# Patient Record
Sex: Male | Born: 1945 | Race: Black or African American | Hispanic: No | Smoking: Former smoker
Health system: Southern US, Community
[De-identification: ages and names within clinical notes are randomized; demographics above are authoritative.]

## PROBLEM LIST (undated history)

## (undated) DIAGNOSIS — I10 Essential (primary) hypertension: Secondary | ICD-10-CM

## (undated) DIAGNOSIS — C801 Malignant (primary) neoplasm, unspecified: Secondary | ICD-10-CM

## (undated) DIAGNOSIS — E119 Type 2 diabetes mellitus without complications: Secondary | ICD-10-CM

---

## 2014-03-31 ENCOUNTER — Emergency Department: Payer: Self-pay | Admitting: Emergency Medicine

## 2016-11-21 ENCOUNTER — Inpatient Hospital Stay
Admission: EM | Admit: 2016-11-21 | Discharge: 2016-11-29 | DRG: 004 | Disposition: A | Discharge status: Home / Self Care | Payer: Medicare Other | Attending: Internal Medicine | Admitting: Internal Medicine

## 2016-11-21 ENCOUNTER — Encounter: Payer: Self-pay | Admitting: Emergency Medicine

## 2016-11-21 DIAGNOSIS — Z888 Allergy status to other drugs, medicaments and biological substances status: Secondary | ICD-10-CM | POA: Diagnosis not present

## 2016-11-21 DIAGNOSIS — I10 Essential (primary) hypertension: Secondary | ICD-10-CM | POA: Diagnosis present

## 2016-11-21 DIAGNOSIS — J9690 Respiratory failure, unspecified, unspecified whether with hypoxia or hypercapnia: Secondary | ICD-10-CM

## 2016-11-21 DIAGNOSIS — Z79899 Other long term (current) drug therapy: Secondary | ICD-10-CM

## 2016-11-21 DIAGNOSIS — J9600 Acute respiratory failure, unspecified whether with hypoxia or hypercapnia: Secondary | ICD-10-CM | POA: Diagnosis present

## 2016-11-21 DIAGNOSIS — Z91013 Allergy to seafood: Secondary | ICD-10-CM | POA: Diagnosis not present

## 2016-11-21 DIAGNOSIS — E876 Hypokalemia: Secondary | ICD-10-CM | POA: Diagnosis not present

## 2016-11-21 DIAGNOSIS — T783XXA Angioneurotic edema, initial encounter: Secondary | ICD-10-CM | POA: Diagnosis present

## 2016-11-21 DIAGNOSIS — Z930 Tracheostomy status: Secondary | ICD-10-CM

## 2016-11-21 DIAGNOSIS — M6281 Muscle weakness (generalized): Secondary | ICD-10-CM

## 2016-11-21 DIAGNOSIS — E1165 Type 2 diabetes mellitus with hyperglycemia: Secondary | ICD-10-CM | POA: Diagnosis present

## 2016-11-21 HISTORY — DX: Type 2 diabetes mellitus without complications: E11.9

## 2016-11-21 HISTORY — DX: Malignant (primary) neoplasm, unspecified: C80.1

## 2016-11-21 HISTORY — DX: Essential (primary) hypertension: I10

## 2016-11-21 LAB — COMPREHENSIVE METABOLIC PANEL
ALBUMIN: 3.5 g/dL (ref 3.5–5.0)
ALT: 20 U/L (ref 17–63)
AST: 27 U/L (ref 15–41)
Alkaline Phosphatase: 61 U/L (ref 38–126)
Anion gap: 9 (ref 5–15)
BUN: 13 mg/dL (ref 6–20)
CHLORIDE: 108 mmol/L (ref 101–111)
CO2: 20 mmol/L — AB (ref 22–32)
CREATININE: 0.71 mg/dL (ref 0.61–1.24)
Calcium: 8.9 mg/dL (ref 8.9–10.3)
GFR calc Af Amer: >60 mL/min (ref >60)
GFR calc non Af Amer: >60 mL/min (ref >60)
GLUCOSE: 259 mg/dL — AB (ref 65–99)
Potassium: 3.4 mmol/L — ABNORMAL LOW (ref 3.5–5.1)
Sodium: 137 mmol/L (ref 135–145)
Total Bilirubin: 0.4 mg/dL (ref 0.3–1.2)
Total Protein: 6.6 g/dL (ref 6.5–8.1)

## 2016-11-21 LAB — CBC WITH DIFFERENTIAL/PLATELET
Basophils Absolute: 0 10*3/uL (ref 0–0.1)
Basophils Relative: 1 %
Eosinophils Absolute: 0.1 10*3/uL (ref 0–0.7)
Eosinophils Relative: 1 %
HEMATOCRIT: 36.4 % — AB (ref 40.0–52.0)
HEMOGLOBIN: 11.6 g/dL — AB (ref 13.0–18.0)
Lymphocytes Relative: 45 %
Lymphs Abs: 4 10*3/uL — ABNORMAL HIGH (ref 1.0–3.6)
MCH: 27.8 pg (ref 26.0–34.0)
MCHC: 31.8 g/dL — AB (ref 32.0–36.0)
MCV: 87.5 fL (ref 80.0–100.0)
MONOS PCT: 7 %
Monocytes Absolute: 0.7 10*3/uL (ref 0.2–1.0)
NEUTROS ABS: 4.1 10*3/uL (ref 1.4–6.5)
NEUTROS PCT: 46 %
Platelets: 257 10*3/uL (ref 150–440)
RBC: 4.16 MIL/uL — ABNORMAL LOW (ref 4.40–5.90)
RDW: 14.7 % — ABNORMAL HIGH (ref 11.5–14.5)
WBC: 8.9 10*3/uL (ref 3.8–10.6)

## 2016-11-21 LAB — PROTIME-INR
INR: 1.03
Prothrombin Time: 13.5 seconds (ref 11.4–15.2)

## 2016-11-21 LAB — GLUCOSE, CAPILLARY: GLUCOSE-CAPILLARY: 154 mg/dL — AB (ref 65–99)

## 2016-11-21 LAB — ETHANOL: Alcohol, Ethyl (B): <5 mg/dL (ref <5)

## 2016-11-21 LAB — LIPASE, BLOOD: Lipase: 21 U/L (ref 11–51)

## 2016-11-21 LAB — TROPONIN I: Troponin I: <0.03 ng/mL (ref <0.03)

## 2016-11-21 LAB — MRSA PCR SCREENING: MRSA BY PCR: NEGATIVE

## 2016-11-21 MED ORDER — PROPOFOL 1000 MG/100ML IV EMUL
INTRAVENOUS | Status: AC
  Filled 2016-11-21: qty 100

## 2016-11-21 MED ORDER — PROPOFOL 1000 MG/100ML IV EMUL
5.0000 ug/kg/min | INTRAVENOUS | Status: DC
  Administered 2016-11-22: 40 ug/kg/min via INTRAVENOUS
  Administered 2016-11-22: 25 ug/kg/min via INTRAVENOUS
  Filled 2016-11-21 (×3): qty 100

## 2016-11-21 MED ORDER — FENTANYL 2500MCG IN NS 250ML (10MCG/ML) PREMIX INFUSION
100.0000 ug/h | INTRAVENOUS | Status: DC
  Administered 2016-11-21: 100 ug/h via INTRAVENOUS

## 2016-11-21 MED ORDER — METHYLPREDNISOLONE SODIUM SUCC 125 MG IJ SOLR
60.0000 mg | Freq: Four times a day (QID) | INTRAMUSCULAR | Status: DC
  Administered 2016-11-22 (×2): 60 mg via INTRAVENOUS
  Filled 2016-11-21 (×2): qty 2

## 2016-11-21 MED ORDER — FAMOTIDINE IN NACL 20-0.9 MG/50ML-% IV SOLN
20.0000 mg | Freq: Two times a day (BID) | INTRAVENOUS | Status: DC
  Administered 2016-11-22 (×2): 20 mg via INTRAVENOUS
  Filled 2016-11-21 (×2): qty 50

## 2016-11-21 MED ORDER — DIPHENHYDRAMINE HCL 50 MG/ML IJ SOLN
50.0000 mg | Freq: Once | INTRAMUSCULAR | Status: AC
  Administered 2016-11-21: 50 mg via INTRAVENOUS

## 2016-11-21 MED ORDER — SODIUM CHLORIDE 0.9 % IV SOLN
INTRAVENOUS | Status: AC | PRN
  Administered 2016-11-21: 1000 mL via INTRAVENOUS

## 2016-11-21 MED ORDER — FENTANYL 2500MCG IN NS 250ML (10MCG/ML) PREMIX INFUSION
INTRAVENOUS | Status: AC
  Filled 2016-11-21: qty 250

## 2016-11-21 MED ORDER — FAMOTIDINE IN NACL 20-0.9 MG/50ML-% IV SOLN
20.0000 mg | Freq: Once | INTRAVENOUS | Status: AC
  Administered 2016-11-21: 20 mg via INTRAVENOUS

## 2016-11-21 MED ORDER — SODIUM CHLORIDE 0.9 % IV SOLN
3.0000 g | INTRAVENOUS | Status: AC
  Administered 2016-11-22: 3 g via INTRAVENOUS
  Filled 2016-11-21: qty 3

## 2016-11-21 MED ORDER — PROPOFOL 1000 MG/100ML IV EMUL
INTRAVENOUS | Status: AC | PRN
  Administered 2016-11-21: 20 ug/kg/min via INTRAVENOUS

## 2016-11-21 MED ORDER — SUCCINYLCHOLINE CHLORIDE 20 MG/ML IJ SOLN
INTRAMUSCULAR | Status: AC | PRN
  Administered 2016-11-21: 100 mg via INTRAVENOUS

## 2016-11-21 MED ORDER — SODIUM CHLORIDE 0.9 % IV SOLN
INTRAVENOUS | Status: DC
  Administered 2016-11-21: 23:00:00 via INTRAVENOUS

## 2016-11-21 MED ORDER — FENTANYL 2500MCG IN NS 250ML (10MCG/ML) PREMIX INFUSION: 25.0000 ug/h | INTRAVENOUS | Status: DC

## 2016-11-21 MED ORDER — METHYLPREDNISOLONE SODIUM SUCC 125 MG IJ SOLR
125.0000 mg | Freq: Once | INTRAMUSCULAR | Status: AC
  Administered 2016-11-21: 125 mg via INTRAVENOUS

## 2016-11-21 MED ORDER — DIPHENHYDRAMINE HCL 50 MG/ML IJ SOLN
12.5000 mg | Freq: Four times a day (QID) | INTRAMUSCULAR | Status: DC
  Administered 2016-11-22 – 2016-11-23 (×6): 12.5 mg via INTRAVENOUS
  Filled 2016-11-21 (×6): qty 1

## 2016-11-21 MED ORDER — FENTANYL CITRATE (PF) 100 MCG/2ML IJ SOLN
INTRAMUSCULAR | Status: AC | PRN
  Administered 2016-11-21: 100 ug via INTRAVENOUS

## 2016-11-21 MED ORDER — KETAMINE HCL 10 MG/ML IJ SOLN
INTRAMUSCULAR | Status: AC | PRN
  Administered 2016-11-21: 50 mg via INTRAVENOUS
  Administered 2016-11-21: 150 mg via INTRAVENOUS
  Administered 2016-11-21: 100 mg via INTRAVENOUS

## 2016-11-21 MED ORDER — SODIUM CHLORIDE 0.9 % IV SOLN: 250.0000 mL | INTRAVENOUS | Status: DC | PRN

## 2016-11-21 MED ORDER — VECURONIUM BROMIDE 10 MG IV SOLR
10.0000 mg | Freq: Once | INTRAVENOUS | Status: AC
  Administered 2016-11-21: 10 mg via INTRAVENOUS

## 2016-11-21 MED ORDER — EPINEPHRINE 0.3 MG/0.3ML IJ SOAJ
0.3000 mg | Freq: Once | INTRAMUSCULAR | Status: AC
  Administered 2016-11-21: 0.3 mg via INTRAMUSCULAR

## 2016-11-21 MED ORDER — HEPARIN SODIUM (PORCINE) 5000 UNIT/ML IJ SOLN
5000.0000 [IU] | Freq: Three times a day (TID) | INTRAMUSCULAR | Status: DC
  Administered 2016-11-22 – 2016-11-23 (×4): 5000 [IU] via SUBCUTANEOUS
  Filled 2016-11-21 (×4): qty 1

## 2016-11-21 MED ORDER — VECURONIUM BROMIDE 10 MG IV SOLR
INTRAVENOUS | Status: AC | PRN
  Administered 2016-11-21: 10 mg via INTRAVENOUS

## 2016-11-21 NOTE — ED Triage Notes (Signed)
Pt comes into the ED via POV with his wife c/o an allergic reaction.  Family unsure if it is from shellfish (which is what they were eating) or his lisinopril.  Patient presents with angioedema and difficulty breathing.  MD notified of difficult airway, swelling to the tongue and neck.

## 2016-11-21 NOTE — H&P (Signed)
PULMONARY / CRITICAL CARE MEDICINE   Name: Ethan Mills MRN: [id number] DOB: 12-May-1946    ADMISSION DATE:  11/21/2016 CONSULTATION DATE:  11/21/16  REFERRING MD:  Dr. Jeannie Fend  CHIEF COMPLAINT:  Acute respiratory Failure due to Angioedema  HISTORY OF PRESENT ILLNESS:   Ethan Mills is 70 year old male with no medical history on file. History was obtained from his wife present at bedside.  Wife states that patient went out to eat some seafood and came home with swelling of the throat and unable to speak.  Wife also states that he  Takes lisinopril for his high blood pressure.  He had swelling of his throat before but wife states that "it was not this bad".  Patient presented to ED with acute respiratory distress. Due to impending Respiratory Failure, ED physician attempted to intubate with Glyde laryngoscope without success.  Therefore ENT was called and had emergently trached at the bedside.  PCCM team was called to admit the patient.  PAST MEDICAL HISTORY :  He  has no past medical history on file.  PAST SURGICAL HISTORY: He  has no past surgical history on file.  Allergies  Allergen Reactions  . Shellfish Allergy Anaphylaxis    No current facility-administered medications on file prior to encounter.    No current outpatient prescriptions on file prior to encounter.    FAMILY HISTORY:  His has no family status information on file.    SOCIAL HISTORY:  REVIEW OF SYSTEMS:   Unable to obtain as the patient is sedated, trached and mechanically ventilated  SUBJECTIVE:  Unable to obtain as the patient is sedated ,trached and mechanically ventilated.   VITAL SIGNS: BP 98/72   Pulse (!) 105   Resp (!) 21   Ht 5\' 8"  (1.727 m)   Wt 87.3 kg (192 lb 8 oz)   SpO2 100%   BMI 29.27 kg/m   HEMODYNAMICS:    VENTILATOR SETTINGS:    INTAKE / OUTPUT: No intake/output data recorded.  PHYSICAL EXAMINATION: General:  AA male , trached with ET tube  Neuro:  Sedated   HEENT:  Atraumatic, normocephalic, severe swelling of the tongue, jaw and neck Cardiovascular: Tachycardic.S1S2, no MRG noted  Lungs:  Clear bilaterally , no wheezes, crackles or rhonchi noted Abdomen:  Soft, non tender, active bowel sounds Musculoskeletal:  No inflammation/deformity noted Skin:  Grossly intact  LABS:  BMET No results for input(s): NA, K, CL, CO2, BUN, CREATININE, GLUCOSE in the last 168 hours.  Electrolytes No results for input(s): CALCIUM, MG, PHOS in the last 168 hours.  CBC  Recent Labs Lab 11/21/16 1927  WBC 8.9  HGB 11.6*  HCT 36.4*  PLT 257    Coag's  Recent Labs Lab 11/21/16 1927  INR 1.03    Sepsis Markers No results for input(s): LATICACIDVEN, PROCALCITON, O2SATVEN in the last 168 hours.  ABG No results for input(s): PHART, PCO2ART, PO2ART in the last 168 hours.  Liver Enzymes No results for input(s): AST, ALT, ALKPHOS, BILITOT, ALBUMIN in the last 168 hours.  Cardiac Enzymes No results for input(s): TROPONINI, PROBNP in the last 168 hours.  Glucose No results for input(s): GLUCAP in the last 168 hours.  Imaging No results found.   STUDIES:  none  CULTURES: None   ANTIBIOTICS: Ampicillin Salbactam X1 dose  SIGNIFICANT EVENTS: 11/21/16>>  Patient presented to the Ed with Acute respiratory distress related to angioedema requiring emergent trach 12/21 Emergent Trach  LINES/TUBES: none   ASSESSMENT / PLAN:  PULMONARY  A: Acute Respiratory failure related to Angioedema possibly due to lisinopril E P:   Full vent support Keep the patient heavily sedated , RASS -4 TO -5 Propofol/ fentanyl  For sedation Continue solumedrol Continue Benadryl Continue Famotidine  CARDIOVASCULAR A:  No active issues P:  Continuous telemetry Will start pressors to keep MAP goals>65 if needed  RENAL A:   No active issues P:   Follow chemistry Replace electrolytes per usual guidelines  GASTROINTESTINAL A:   No active  issues P:   Will keep NPO HEMATOLOGIC A:   No active issues P:  Heparin for DVT prophylaxis Transfuse  If Hgb<7  INFECTIOUS A:   No active issues P:   Received Ampicillin Sulbactam X 1 for emergent Trach Follow CBC intermittently  ENDOCRINE A:   No active issues P:   BG checks intermittently with BMP  NEUROLOGIC A:   No active issues P:    Maintain RASS goal: of -4 to -5 Will get Re-evaluated by ENT on 12/22    Bincy Varughese,AG-ACNP Pulmonary and [place]   11/21/2016, 7:56 PM   Merton Border, MD PCCM service Mobile [phone] Pager [phone] 11/22/2016

## 2016-11-21 NOTE — Sedation Documentation (Signed)
ENT specialist at bedside

## 2016-11-21 NOTE — Op Note (Signed)
..  11/21/2016 7:37 PM  Ethan Mills [id number]  Pre-Op Dx: acute respiratory failure  Post-Op Dx:  same  Proc:  Emergent BedsideTracheostomy  Surg:  Colvin Caroli:  GOT  EBL:  34ml  Comp:  none  Findings:  6.5 ETT placed within neck.  Difficult landmarks given marked submental and superior neck edema but felt to be either just beneath the cricoid cartilage.  Significant calcification of anterior cartilage.  Procedure:  The patient was emergently identified in the ED.  Ketamine sedation was already pushed by ED in attempt for oral intubation given impending decompensation.  A 15 blade scalpel was used to made a vertical incision in the patient's anterior neck and dissection was made vertically with a 15 blade scalpel until the anterior trachea was encountered.  Moderate amount of calcification was noted and a horizontal incision was made beneath this calcification and the airway was entered.  A boogie catheter was placed through the tracheostomy and a 6.5 ETT was placed overlying this catheter.  Upon first placement, the ETT went superiorly but upon replacement, it went inferiorly with good CO2 return and ventilation with saturations to 100%.    At this time the ETT was secured in place.  Surgicel was placed within the wound bed and a sterile 4X4 was packing adjacent to the ETT.   Comment: Patient will need sedation and needs to be treated as difficult airway.  ICU admission.  Medicine admission at attempt to figure out source of angioedema.  Abx are needed due to dirty case.  Will plan on evaluation in OR possibly tomorrow around noon if revision is needed.  Ethan Mills  7:37 PM 11/21/2016

## 2016-11-21 NOTE — Progress Notes (Signed)
Transported pt to ICU 3 on the vent without incident. Pt tol well report handed off. Decreased pt FIO2 40%.

## 2016-11-21 NOTE — ED Provider Notes (Signed)
[facility] Emergency Department Provider Note  ____________________________________________   First MD Initiated Contact with Patient 11/21/16 1849     (approximate)  I have reviewed the triage vital signs and the nursing notes.   HISTORY  Chief Complaint No chief complaint on file.  History limited by acute angioedema  HPI Ethan Mills is a 70 y.o. male had any reported medical history due to the urgency of situation who presents with acute onset swelling of his tongue and throat.  This reportedly started just minutes ago.  His wife was not sure what happened, but apparently the patient ate some seafood and then developed some swelling.  However he has eaten seafood before and only had some mild swelling around his eyes.  Within the unknown number of minutes since the onset of symptoms, his is swollen to the point that he cannot close his mouth and he has started to drool.  He denies any difficulty breathing but is only able to speak in a couple of words and is having trouble swallowing.  No other history is obtainable at this time.  I asked the patient and his wife if they know if he is on a medication called lisinopril or something that sounds like that, and they do not know.  I mentioned that he goes to the [place] but upon subsequent review of care everywhere I cannot find records with the [facility].Of note the patient has had no recent infectious symptoms and has not had a sore throat or dental problems of which anyone is aware.   No past medical history on file.  There are no active problems to display for this patient.   No past surgical history on file.  Prior to Admission medications   Not on File    Allergies Shellfish allergy  No family history on file.  Social History Level 5 caveat: Unable to obtain due to critical illness   Review of Systems L5 caveat: Unable to obtain due to critical  illness  ____________________________________________   PHYSICAL EXAM:  VITAL SIGNS: ED Triage Vitals  Enc Vitals Group     BP 11/21/16 1923 (!) 201/90     Pulse Rate 11/21/16 1923 (!) 127     Resp 11/21/16 1923 (!) 21     Temp --      Temp src --      SpO2 11/21/16 1923 100 %     Weight 11/21/16 1853 192 lb 8 oz (87.3 kg)     Height 11/21/16 1853 5\' 8"  (1.727 m)     Head Circumference --      Peak Flow --      Pain Score --      Pain Loc --      Pain Edu? --      Excl. in [place]? --     Constitutional: Alert and oriented.  Angioedema is obvious at a distance Eyes: Conjunctivae are normal. PERRL. EOMI. Head: Atraumatic. Nose: No congestion/rhinnorhea. Mouth/Throat/Neck: Mucous membranes are moist.  Severe swelling of the tongue and additional swelling that is firm is palpable below the jaw (submandibular induration) insistent with severe angioedema that appears to be tracking posteriorly.  The patient is gurgling and drooling and maintaining his airway currently not able to swallow well Cardiovascular: Tachycardia, regular rhythm. Good peripheral circulation. Grossly normal heart sounds. Respiratory: Increased respiratory effort.  No retractions. Lungs CTAB. Gastrointestinal: Soft and nontender. No distention.  Musculoskeletal: No lower extremity tenderness nor edema. No gross deformities of  extremities. Neurologic:  Normal speech and language. No gross focal neurologic deficits are appreciated.  Skin:  Skin is warm, dry and intact. No rash noted.  ____________________________________________   LABS (all labs ordered are listed, but only abnormal results are displayed)  Labs Reviewed  CBC WITH DIFFERENTIAL/PLATELET - Abnormal; Notable for the following:       Result Value   RBC 4.16 (*)    Hemoglobin 11.6 (*)    HCT 36.4 (*)    MCHC 31.8 (*)    RDW 14.7 (*)    Lymphs Abs 4.0 (*)    All other components within normal limits  PROTIME-INR  COMPREHENSIVE METABOLIC PANEL   ETHANOL  LIPASE, BLOOD  TROPONIN I  CBC  BASIC METABOLIC PANEL  MAGNESIUM  PHOSPHORUS  TYPE AND SCREEN   ____________________________________________  EKG  None - EKG not ordered by ED physician ____________________________________________  RADIOLOGY   No results found.  ____________________________________________   PROCEDURES  Procedure(s) performed:   .Critical Care Performed by: Hinda Kehr Authorized by: Hinda Kehr   Critical care provider statement:    Critical care time (minutes):  60   Critical care time was exclusive of:  Separately billable procedures and treating other patients   Critical care was necessary to treat or prevent imminent or life-threatening deterioration of the following conditions:  Respiratory failure   Critical care was time spent personally by me on the following activities:  Development of treatment plan with patient or surrogate, discussions with consultants, evaluation of patient's response to treatment, examination of patient, obtaining history from patient or surrogate, ordering and performing treatments and interventions, ordering and review of laboratory studies, ordering and review of radiographic studies, pulse oximetry, re-evaluation of patient's condition and review of old charts Procedure Name: Awake intubation Date/Time: 11/21/2016 7:45 PM Performed by: Hinda Kehr Pre-anesthesia Checklist: Patient identified, Emergency Drugs available and Patient being monitored Oxygen Delivery Method: Nasal cannula Intubation Type: IV induction and Rapid sequence Laryngoscope Size: Glidescope and 3 Tube size: 6.5 mm Number of attempts: 2 Airway Equipment and Method: Oral airway Difficulty Due To: Difficulty was anticipated, Difficult Airway- due to large tongue, Difficult Airway-  due to edematous airway and Difficult Airway-due to vocal cord/laryngeal edema Comments: Failure of intubation due to severe angioedema - emergent  tracheotomy was required and performed by Dr. Pryor Ochoa (ENT)        Critical Care performed: Yes, see critical care procedure note(s) ____________________________________________   INITIAL IMPRESSION / ASSESSMENT AND PLAN / ED COURSE  Pertinent labs & imaging results that were available during my care of the patient were reviewed by me and considered in my medical decision making (see chart for details).  History is assessed the patient I realized that he would need an emergent airway and possibly cricothyrotomy or tracheotomy.  We moved him to room 3 and obtained all of the difficult airway equipment and intubation equipment.  I also called and spoke by phone with Dr. Pryor Ochoa ENT specialist and told them he was needed emergently.  Dr. Pryor Ochoa was already in the car and driving and at the time that we hung up with an estimated time of arrival about 13 minutes.  In the meantime we got the patient presents to the monitor and he was acutely decompensating even over the few minutes that were passing with developing stridor and increasing difficulty breathing.  He remained alert and oriented throughout with oxygen saturation of 100% with a nasal cannula on 10 L in his nose.  Once  IV access was established and he was on the monitor and administered ketamine in order to attempt intubation by glidescope.  The amount of angioedema was severe and I could not visualize anything of the patient's airway.  I attempted to use a bougie but that was also unsuccessful I tried one more attempt with a Glidescope and at that point Dr. Pryor Ochoa arrived.  He visualized airway once with the glide scope and then moved to surgical airway.  I assisted him while he performed a bedside tracheotomy which was successful with passage of a 5-0 ETT with good color change.  We started him on a propofol and fentanyl drips and gave vecuronium 10 mg IV for paralytic.  Will contact ICU for admission.  Patient hemodynamically stable at this  time.    Clinical Course as of Nov 21 2012  Thu Nov 21, 2016  1940 Spoke by phone with [facility] physician.  The mid-level provider is also present in the ED and evaluating the patient in person in room 3.  [CF]  1945 Ordering Unasyn 3 g IV given the emergent nature of the procedure and the fact that while we did our best to maintain sterile precautions it was not a completely sterile airway as it would have been in the operating room.  [CF]    Clinical Course User Index [CF] Hinda Kehr, MD    ____________________________________________  FINAL CLINICAL IMPRESSION(S) / ED DIAGNOSES  Final diagnoses:  Angioedema, initial encounter  Acute respiratory failure, unspecified whether with hypoxia or hypercapnia (Home)     MEDICATIONS GIVEN DURING THIS VISIT:  Medications  Ampicillin-Sulbactam (UNASYN) 3 g in sodium chloride 0.9 % 100 mL IVPB (not administered)  0.9 %  sodium chloride infusion (not administered)  heparin injection 5,000 Units (not administered)  famotidine (PEPCID) IVPB 20 mg premix (not administered)  0.9 %  sodium chloride infusion (not administered)  propofol (DIPRIVAN) 1000 MG/100ML infusion (not administered)  fentaNYL 2521mcg in NS 265mL (45mcg/ml) infusion-PREMIX (not administered)  diphenhydrAMINE (BENADRYL) injection 12.5 mg (not administered)  methylPREDNISolone sodium succinate (SOLU-MEDROL) 125 mg/2 mL injection 60 mg (not administered)  EPINEPHrine (EPI-PEN) injection 0.3 mg (0.3 mg Intramuscular Given 11/21/16 1858)  methylPREDNISolone sodium succinate (SOLU-MEDROL) 125 mg/2 mL injection 125 mg (125 mg Intravenous Given 11/21/16 1859)  diphenhydrAMINE (BENADRYL) injection 50 mg (50 mg Intravenous Given 11/21/16 1859)  famotidine (PEPCID) IVPB 20 mg premix (20 mg Intravenous New Bag/Given 11/21/16 1939)  ketamine (KETALAR) injection (150 mg Intravenous Given 11/21/16 1908)  succinylcholine (ANECTINE) injection (100 mg Intravenous Given 11/21/16 1911)   vecuronium (NORCURON) injection (10 mg Intravenous Given 11/21/16 1920)  propofol (DIPRIVAN) 1000 MG/100ML infusion ( Intravenous Canceled Entry 11/21/16 1930)  0.9 %  sodium chloride infusion (1,000 mLs Intravenous New Bag/Given 11/21/16 1900)  fentaNYL (SUBLIMAZE) injection (100 mcg Intravenous Given 11/21/16 1913)     NEW OUTPATIENT MEDICATIONS STARTED DURING THIS VISIT:  New Prescriptions   No medications on file    Modified Medications   No medications on file    Discontinued Medications   No medications on file     Note:  This document was prepared using Dragon voice recognition software and may include unintentional dictation errors.    Hinda Kehr, MD 11/21/16 2013

## 2016-11-21 NOTE — ED Notes (Signed)
Attempted to call report to RN on the floor, RN unavailable at this time.  Will return my call.

## 2016-11-21 NOTE — Consult Note (Signed)
..   Ethan Mills, Ethan Mills [id number] 06-30-1946 Hinda Kehr, MD  Reason for Consult: respiratory distress  HPI: Called emergently to ED for airway compromise by Dr. Karma Greaser.  Patient showed up to ED in acute respiratory distress.  Due to impending airway failure, ED physicians attempted to intubate with Glyde laryngoscope without success.    Allergies:  Allergies  Allergen Reactions  . Shellfish Allergy Anaphylaxis    ROS: Review of systems normal other than 12 systems except per HPI.  PMH: No past medical history on file.  FH: No family history on file.  SH:  Social History   Social History  . Marital status: Widowed    Spouse name: N/A  . Number of children: N/A  . Years of education: N/A   Occupational History  . Not on file.   Social History Main Topics  . Smoking status: Not on file  . Smokeless tobacco: Not on file  . Alcohol use Not on file  . Drug use: Unknown  . Sexual activity: Not on file   Other Topics Concern  . Not on file   Social History Narrative  . No narrative on file    PSH: No past surgical history on file.  Physical  Exam: GEN-  Acute distress supine in bed   A/P: Emergent bedside tracheostomy tube placed with 6.5 ETT in neck.  Will plan on revision tomorrow around noon.  Steroids and abx given dirty case.   Ethan Mills 11/21/2016 7:33 PM

## 2016-11-21 NOTE — Progress Notes (Signed)
CH met with wife of Pt in hallway while medical attention given to Pt. Wife has survived breast cancer twice. Married to Pt for 16 years. Sister and brother in law arrived. CH is available.   11/21/16 1900  Clinical Encounter Type  Visited With Patient and family together;Health care provider  Visit Type ED  Referral From Nurse  Spiritual Encounters  Spiritual Needs Emotional  Stress Factors  Patient Stress Factors None identified  Family Stress Factors None identified

## 2016-11-22 ENCOUNTER — Inpatient Hospital Stay: Payer: Medicare Other | Admitting: Anesthesiology

## 2016-11-22 ENCOUNTER — Encounter
Admission: EM | Disposition: A | Discharge status: Home / Self Care | Payer: Self-pay | Source: Home / Self Care | Attending: Internal Medicine

## 2016-11-22 ENCOUNTER — Encounter: Payer: Self-pay | Admitting: Anesthesiology

## 2016-11-22 DIAGNOSIS — J9600 Acute respiratory failure, unspecified whether with hypoxia or hypercapnia: Secondary | ICD-10-CM

## 2016-11-22 HISTORY — PX: TRACHEOSTOMY TUBE PLACEMENT: SHX814

## 2016-11-22 LAB — BASIC METABOLIC PANEL
ANION GAP: 8 (ref 5–15)
BUN: 13 mg/dL (ref 6–20)
CO2: 20 mmol/L — ABNORMAL LOW (ref 22–32)
Calcium: 8.3 mg/dL — ABNORMAL LOW (ref 8.9–10.3)
Chloride: 109 mmol/L (ref 101–111)
Creatinine, Ser: 0.78 mg/dL (ref 0.61–1.24)
GFR calc Af Amer: >60 mL/min (ref >60)
GLUCOSE: 180 mg/dL — AB (ref 65–99)
POTASSIUM: 4.2 mmol/L (ref 3.5–5.1)
Sodium: 137 mmol/L (ref 135–145)

## 2016-11-22 LAB — CBC
HEMATOCRIT: 31.6 % — AB (ref 40.0–52.0)
Hemoglobin: 10.1 g/dL — ABNORMAL LOW (ref 13.0–18.0)
MCH: 27.7 pg (ref 26.0–34.0)
MCHC: 31.9 g/dL — ABNORMAL LOW (ref 32.0–36.0)
MCV: 86.8 fL (ref 80.0–100.0)
PLATELETS: 219 10*3/uL (ref 150–440)
RBC: 3.63 MIL/uL — AB (ref 4.40–5.90)
RDW: 14.2 % (ref 11.5–14.5)
WBC: 11.4 10*3/uL — AB (ref 3.8–10.6)

## 2016-11-22 LAB — GLUCOSE, CAPILLARY
GLUCOSE-CAPILLARY: 141 mg/dL — AB (ref 65–99)
Glucose-Capillary: 165 mg/dL — ABNORMAL HIGH (ref 65–99)

## 2016-11-22 LAB — MAGNESIUM: Magnesium: 1.5 mg/dL — ABNORMAL LOW (ref 1.7–2.4)

## 2016-11-22 LAB — PHOSPHORUS: Phosphorus: 3.1 mg/dL (ref 2.5–4.6)

## 2016-11-22 SURGERY — CREATION, TRACHEOSTOMY
Anesthesia: General | Wound class: Clean Contaminated

## 2016-11-22 MED ORDER — HYDRALAZINE HCL 20 MG/ML IJ SOLN
10.0000 mg | Freq: Four times a day (QID) | INTRAMUSCULAR | Status: DC | PRN
  Administered 2016-11-22 – 2016-11-23 (×2): 10 mg via INTRAVENOUS
  Filled 2016-11-22 (×2): qty 1

## 2016-11-22 MED ORDER — FENTANYL CITRATE (PF) 100 MCG/2ML IJ SOLN
INTRAMUSCULAR | Status: AC
  Filled 2016-11-22: qty 2

## 2016-11-22 MED ORDER — ONDANSETRON HCL 4 MG/2ML IJ SOLN
INTRAMUSCULAR | Status: DC | PRN
  Administered 2016-11-22: 4 mg via INTRAVENOUS

## 2016-11-22 MED ORDER — PROPOFOL 500 MG/50ML IV EMUL
INTRAVENOUS | Status: AC
  Filled 2016-11-22: qty 50

## 2016-11-22 MED ORDER — FENTANYL CITRATE (PF) 100 MCG/2ML IJ SOLN
INTRAMUSCULAR | Status: DC | PRN
  Administered 2016-11-22: 50 ug via INTRAVENOUS

## 2016-11-22 MED ORDER — VECURONIUM BROMIDE 10 MG IV SOLR
10.0000 mg | INTRAVENOUS | Status: AC
  Administered 2016-11-22: 10 mg via INTRAVENOUS

## 2016-11-22 MED ORDER — ORAL CARE MOUTH RINSE
15.0000 mL | Freq: Two times a day (BID) | OROMUCOSAL | Status: DC
  Administered 2016-11-23 – 2016-11-28 (×10): 15 mL via OROMUCOSAL

## 2016-11-22 MED ORDER — MIDAZOLAM HCL 2 MG/2ML IJ SOLN
INTRAMUSCULAR | Status: AC
  Filled 2016-11-22: qty 2

## 2016-11-22 MED ORDER — INSULIN ASPART 100 UNIT/ML ~~LOC~~ SOLN
0.0000 [IU] | SUBCUTANEOUS | Status: DC
  Administered 2016-11-22 – 2016-11-23 (×2): 3 [IU] via SUBCUTANEOUS
  Administered 2016-11-23: 4 [IU] via SUBCUTANEOUS
  Administered 2016-11-23: 3 [IU] via SUBCUTANEOUS
  Filled 2016-11-22: qty 4
  Filled 2016-11-22 (×3): qty 3
  Filled 2016-11-22: qty 4

## 2016-11-22 MED ORDER — LIDOCAINE 2% (20 MG/ML) 5 ML SYRINGE
INTRAMUSCULAR | Status: AC
  Filled 2016-11-22: qty 5

## 2016-11-22 MED ORDER — PROPOFOL 500 MG/50ML IV EMUL
INTRAVENOUS | Status: DC | PRN
Start: 2016-11-22 — End: 2016-11-22
  Administered 2016-11-22: 40 ug/kg/min via INTRAVENOUS

## 2016-11-22 MED ORDER — ORAL CARE MOUTH RINSE
15.0000 mL | Freq: Four times a day (QID) | OROMUCOSAL | Status: DC
  Administered 2016-11-22: 15 mL via OROMUCOSAL

## 2016-11-22 MED ORDER — METHYLPREDNISOLONE SODIUM SUCC 125 MG IJ SOLR
40.0000 mg | Freq: Two times a day (BID) | INTRAMUSCULAR | Status: DC
  Administered 2016-11-22 – 2016-11-23 (×2): 40 mg via INTRAVENOUS
  Filled 2016-11-22 (×2): qty 2

## 2016-11-22 MED ORDER — ONDANSETRON HCL 4 MG/2ML IJ SOLN
INTRAMUSCULAR | Status: AC
  Filled 2016-11-22: qty 2

## 2016-11-22 MED ORDER — STERILE WATER FOR INJECTION IJ SOLN
INTRAMUSCULAR | Status: AC
  Administered 2016-11-22: 04:00:00
  Filled 2016-11-22: qty 10

## 2016-11-22 MED ORDER — SODIUM CHLORIDE 0.9 % IV SOLN
INTRAVENOUS | Status: DC | PRN
  Administered 2016-11-22: 150 ug/h via INTRAVENOUS

## 2016-11-22 MED ORDER — LACTATED RINGERS IV SOLN
INTRAVENOUS | Status: DC
  Administered 2016-11-22 – 2016-11-25 (×5): via INTRAVENOUS

## 2016-11-22 MED ORDER — CHLORHEXIDINE GLUCONATE 0.12% ORAL RINSE (MEDLINE KIT)
15.0000 mL | Freq: Two times a day (BID) | OROMUCOSAL | Status: DC
  Administered 2016-11-22 – 2016-11-29 (×13): 15 mL via OROMUCOSAL

## 2016-11-22 MED ORDER — VECURONIUM BROMIDE 10 MG IV SOLR
INTRAVENOUS | Status: AC
  Administered 2016-11-22: 04:00:00
  Filled 2016-11-22: qty 10

## 2016-11-22 MED ORDER — MIDAZOLAM HCL 2 MG/2ML IJ SOLN
INTRAMUSCULAR | Status: DC | PRN
  Administered 2016-11-22: 2 mg via INTRAVENOUS

## 2016-11-22 SURGICAL SUPPLY — 29 items
BLADE SURG 15 STRL LF DISP TIS (BLADE) ×1 IMPLANT
BLADE SURG 15 STRL SS (BLADE) ×2
BLADE SURG SZ11 CARB STEEL (BLADE) ×3 IMPLANT
CANISTER SUCT 1200ML W/VALVE (MISCELLANEOUS) ×3 IMPLANT
CORD BIP STRL DISP 12FT (MISCELLANEOUS) ×3 IMPLANT
ELECT REM PT RETURN 9FT ADLT (ELECTROSURGICAL) ×3
ELECTRODE REM PT RTRN 9FT ADLT (ELECTROSURGICAL) ×1 IMPLANT
FORCEPS JEWEL BIP 4-3/4 STR (INSTRUMENTS) ×3 IMPLANT
GAUZE IODOFORM PACK 1/2 7832 (GAUZE/BANDAGES/DRESSINGS) ×3 IMPLANT
GLOVE BIO SURGEON STRL SZ7.5 (GLOVE) ×3 IMPLANT
GOWN STRL REUS W/ TWL LRG LVL3 (GOWN DISPOSABLE) ×2 IMPLANT
GOWN STRL REUS W/TWL LRG LVL3 (GOWN DISPOSABLE) ×4
HARMONIC SCALPEL FOCUS (MISCELLANEOUS) ×3 IMPLANT
HEMOSTAT SURGICEL 2X3 (HEMOSTASIS) ×3 IMPLANT
HLDR TRACH TUBE NECKBAND 18 (MISCELLANEOUS) ×1 IMPLANT
HOLDER TRACH TUBE NECKBAND 18 (MISCELLANEOUS) ×2
KIT RM TURNOVER STRD PROC AR (KITS) ×3 IMPLANT
LABEL OR SOLS (LABEL) ×3 IMPLANT
NS IRRIG 500ML POUR BTL (IV SOLUTION) ×3 IMPLANT
PACK HEAD/NECK (MISCELLANEOUS) ×3 IMPLANT
SPONGE DRAIN TRACH 4X4 STRL 2S (GAUZE/BANDAGES/DRESSINGS) ×3 IMPLANT
SPONGE KITTNER 5P (MISCELLANEOUS) ×3 IMPLANT
SUCTION FRAZIER HANDLE 10FR (MISCELLANEOUS) ×2
SUCTION TUBE FRAZIER 10FR DISP (MISCELLANEOUS) ×1 IMPLANT
SUT SILK 2 0 SH (SUTURE) ×12 IMPLANT
SUT VIC AB 3-0 PS2 18 (SUTURE) ×3 IMPLANT
SYR 3ML LL SCALE MARK (SYRINGE) ×3 IMPLANT
TUBE TRACH SHILEY  6 DIST  CUF (TUBING) ×3 IMPLANT
TUBE TRACH SHILEY 8 DIST CUF (TUBING) ×3 IMPLANT

## 2016-11-22 NOTE — Progress Notes (Signed)
.. 11/22/2016 4:55 PM  Ethan Mills [id number]  Post-Op Day 1,0    Temp:  [97 F (36.1 C)-99 F (37.2 C)] 97.5 F (36.4 C) (12/22 1600) Pulse Rate:  [66-127] 86 (12/22 1600) Resp:  [9-22] 9 (12/22 1600) BP: (88-205)/(56-167) 133/80 (12/22 1600) SpO2:  [96 %-100 %] 100 % (12/22 1600) FiO2 (%):  [28 %-100 %] 28 % (12/22 1600) Weight:  [87.1 kg (192 lb)-87.3 kg (192 lb 8 oz)] 87.1 kg (192 lb) (12/21 2027),     Intake/Output Summary (Last 24 hours) at 11/22/16 1655 Last data filed at 11/22/16 1550  Gross per 24 hour  Intake          1641.64 ml  Output             1805 ml  Net          -163.36 ml    Results for orders placed or performed during the hospital encounter of 11/21/16 (from the past 24 hour(s))  Comprehensive metabolic panel     Status: Abnormal   Collection Time: 11/21/16  7:27 PM  Result Value Ref Range   Sodium 137 135 - 145 mmol/L   Potassium 3.4 (L) 3.5 - 5.1 mmol/L   Chloride 108 101 - 111 mmol/L   CO2 20 (L) 22 - 32 mmol/L   Glucose, Bld 259 (H) 65 - 99 mg/dL   BUN 13 6 - 20 mg/dL   Creatinine, Ser 0.71 0.61 - 1.24 mg/dL   Calcium 8.9 8.9 - 10.3 mg/dL   Total Protein 6.6 6.5 - 8.1 g/dL   Albumin 3.5 3.5 - 5.0 g/dL   AST 27 15 - 41 U/L   ALT 20 17 - 63 U/L   Alkaline Phosphatase 61 38 - 126 U/L   Total Bilirubin 0.4 0.3 - 1.2 mg/dL   GFR calc non Af Amer >60 >60 mL/min   GFR calc Af Amer >60 >60 mL/min   Anion gap 9 5 - 15  Ethanol     Status: None   Collection Time: 11/21/16  7:27 PM  Result Value Ref Range   Alcohol, Ethyl (B) <5 <5 mg/dL  Lipase, blood     Status: None   Collection Time: 11/21/16  7:27 PM  Result Value Ref Range   Lipase 21 11 - 51 U/L  Troponin I     Status: None   Collection Time: 11/21/16  7:27 PM  Result Value Ref Range   Troponin I <0.03 <0.03 ng/mL  CBC with Differential     Status: Abnormal   Collection Time: 11/21/16  7:27 PM  Result Value Ref Range   WBC 8.9 3.8 - 10.6 K/uL   RBC 4.16 (L) 4.40 - 5.90 MIL/uL    Hemoglobin 11.6 (L) 13.0 - 18.0 g/dL   HCT 36.4 (L) 40.0 - 52.0 %   MCV 87.5 80.0 - 100.0 fL   MCH 27.8 26.0 - 34.0 pg   MCHC 31.8 (L) 32.0 - 36.0 g/dL   RDW 14.7 (H) 11.5 - 14.5 %   Platelets 257 150 - 440 K/uL   Neutrophils Relative % 46 %   Neutro Abs 4.1 1.4 - 6.5 K/uL   Lymphocytes Relative 45 %   Lymphs Abs 4.0 (H) 1.0 - 3.6 K/uL   Monocytes Relative 7 %   Monocytes Absolute 0.7 0.2 - 1.0 K/uL   Eosinophils Relative 1 %   Eosinophils Absolute 0.1 0 - 0.7 K/uL   Basophils Relative 1 %   Basophils Absolute  0.0 0 - 0.1 K/uL  Protime-INR     Status: None   Collection Time: 11/21/16  7:27 PM  Result Value Ref Range   Prothrombin Time 13.5 11.4 - 15.2 seconds   INR 1.03   Blood gas, arterial     Status: Abnormal (Preliminary result)   Collection Time: 11/21/16  8:41 PM  Result Value Ref Range   FIO2 1.00    Delivery systems VENTILATOR    Mode PRESSURE REGULATED VOLUME CONTROL    VT 500 mL   LHR 16 resp/min   Peep/cpap 5.0 cm H20   Inspiratory PAP PENDING    Expiratory PAP PENDING    pH, Arterial 7.36 7.350 - 7.450   pCO2 arterial 34 32.0 - 48.0 mmHg   pO2, Arterial 336 (H) 83.0 - 108.0 mmHg   Bicarbonate 19.2 (L) 20.0 - 28.0 mmol/L   Acid-base deficit 5.5 (H) 0.0 - 2.0 mmol/L   O2 Saturation 99.9 %   Patient temperature 37.0    Oxygen index PENDING    Collection site RIGHT RADIAL    Sample type ARTERIAL DRAW    Allens test (pass/fail) PENDING PASS  Glucose, capillary     Status: Abnormal   Collection Time: 11/21/16 10:00 PM  Result Value Ref Range   Glucose-Capillary 154 (H) 65 - 99 mg/dL  MRSA PCR Screening     Status: None   Collection Time: 11/21/16 10:03 PM  Result Value Ref Range   MRSA by PCR NEGATIVE NEGATIVE  CBC     Status: Abnormal   Collection Time: 11/22/16  6:41 AM  Result Value Ref Range   WBC 11.4 (H) 3.8 - 10.6 K/uL   RBC 3.63 (L) 4.40 - 5.90 MIL/uL   Hemoglobin 10.1 (L) 13.0 - 18.0 g/dL   HCT 31.6 (L) 40.0 - 52.0 %   MCV 86.8 80.0 - 100.0  fL   MCH 27.7 26.0 - 34.0 pg   MCHC 31.9 (L) 32.0 - 36.0 g/dL   RDW 14.2 11.5 - 14.5 %   Platelets 219 150 - 440 K/uL  Basic metabolic panel     Status: Abnormal   Collection Time: 11/22/16  6:41 AM  Result Value Ref Range   Sodium 137 135 - 145 mmol/L   Potassium 4.2 3.5 - 5.1 mmol/L   Chloride 109 101 - 111 mmol/L   CO2 20 (L) 22 - 32 mmol/L   Glucose, Bld 180 (H) 65 - 99 mg/dL   BUN 13 6 - 20 mg/dL   Creatinine, Ser 0.78 0.61 - 1.24 mg/dL   Calcium 8.3 (L) 8.9 - 10.3 mg/dL   GFR calc non Af Amer >60 >60 mL/min   GFR calc Af Amer >60 >60 mL/min   Anion gap 8 5 - 15  Magnesium     Status: Abnormal   Collection Time: 11/22/16  6:41 AM  Result Value Ref Range   Magnesium 1.5 (L) 1.7 - 2.4 mg/dL  Phosphorus     Status: None   Collection Time: 11/22/16  6:41 AM  Result Value Ref Range   Phosphorus 3.1 2.5 - 4.6 mg/dL    SUBJECTIVE:  Doing well following conversion from cricothyroidotomy.  Off vent.  Responds to questions.  Mild bloody secretions.  OBJECTIVE:   GEN-  Resting comfortably NECK-  Size 6 cuffed shiley in place and secure  IMPRESSION:  S/p trach for angioedema doing well  PLAN:  1)  OK to deflate cuff tomorrow if still off vent 2)  Diet per  speech once cuff deflated 3)  OK to remove betadine soaked gauze and change prn starting on 11/23/2016 4)  Will remove sutures on POD#5 and depending on  How patient is doing, possibly remove tracheostomy at that time versus change to size 4 cuffless.  Ethan Mills 11/22/2016, 4:55 PM

## 2016-11-22 NOTE — Progress Notes (Signed)
.. 11/22/2016 7:25 AM  Ethan Mills [id number]  Post-Op Day 1    Temp:  [97.5 F (36.4 C)-98.4 F (36.9 C)] 98.4 F (36.9 C) (12/22 0600) Pulse Rate:  [82-127] 82 (12/22 0600) Resp:  [10-22] 16 (12/22 0600) BP: (88-205)/(58-167) 92/58 (12/22 0600) SpO2:  [96 %-100 %] 100 % (12/22 0600) FiO2 (%):  [35 %-100 %] 35 % (12/22 0400) Weight:  [87.1 kg (192 lb)-87.3 kg (192 lb 8 oz)] 87.1 kg (192 lb) (12/21 2027),     Intake/Output Summary (Last 24 hours) at 11/22/16 0725 Last data filed at 11/22/16 0600  Gross per 24 hour  Intake            672.8 ml  Output              800 ml  Net           -127.2 ml    Results for orders placed or performed during the hospital encounter of 11/21/16 (from the past 24 hour(s))  Comprehensive metabolic panel     Status: Abnormal   Collection Time: 11/21/16  7:27 PM  Result Value Ref Range   Sodium 137 135 - 145 mmol/L   Potassium 3.4 (L) 3.5 - 5.1 mmol/L   Chloride 108 101 - 111 mmol/L   CO2 20 (L) 22 - 32 mmol/L   Glucose, Bld 259 (H) 65 - 99 mg/dL   BUN 13 6 - 20 mg/dL   Creatinine, Ser 0.71 0.61 - 1.24 mg/dL   Calcium 8.9 8.9 - 10.3 mg/dL   Total Protein 6.6 6.5 - 8.1 g/dL   Albumin 3.5 3.5 - 5.0 g/dL   AST 27 15 - 41 U/L   ALT 20 17 - 63 U/L   Alkaline Phosphatase 61 38 - 126 U/L   Total Bilirubin 0.4 0.3 - 1.2 mg/dL   GFR calc non Af Amer >60 >60 mL/min   GFR calc Af Amer >60 >60 mL/min   Anion gap 9 5 - 15  Ethanol     Status: None   Collection Time: 11/21/16  7:27 PM  Result Value Ref Range   Alcohol, Ethyl (B) <5 <5 mg/dL  Lipase, blood     Status: None   Collection Time: 11/21/16  7:27 PM  Result Value Ref Range   Lipase 21 11 - 51 U/L  Troponin I     Status: None   Collection Time: 11/21/16  7:27 PM  Result Value Ref Range   Troponin I <0.03 <0.03 ng/mL  CBC with Differential     Status: Abnormal   Collection Time: 11/21/16  7:27 PM  Result Value Ref Range   WBC 8.9 3.8 - 10.6 K/uL   RBC 4.16 (L) 4.40 - 5.90  MIL/uL   Hemoglobin 11.6 (L) 13.0 - 18.0 g/dL   HCT 36.4 (L) 40.0 - 52.0 %   MCV 87.5 80.0 - 100.0 fL   MCH 27.8 26.0 - 34.0 pg   MCHC 31.8 (L) 32.0 - 36.0 g/dL   RDW 14.7 (H) 11.5 - 14.5 %   Platelets 257 150 - 440 K/uL   Neutrophils Relative % 46 %   Neutro Abs 4.1 1.4 - 6.5 K/uL   Lymphocytes Relative 45 %   Lymphs Abs 4.0 (H) 1.0 - 3.6 K/uL   Monocytes Relative 7 %   Monocytes Absolute 0.7 0.2 - 1.0 K/uL   Eosinophils Relative 1 %   Eosinophils Absolute 0.1 0 - 0.7 K/uL   Basophils Relative 1 %  Basophils Absolute 0.0 0 - 0.1 K/uL  Protime-INR     Status: None   Collection Time: 11/21/16  7:27 PM  Result Value Ref Range   Prothrombin Time 13.5 11.4 - 15.2 seconds   INR 1.03   Blood gas, arterial     Status: Abnormal (Preliminary result)   Collection Time: 11/21/16  8:41 PM  Result Value Ref Range   FIO2 1.00    Delivery systems VENTILATOR    Mode PRESSURE REGULATED VOLUME CONTROL    VT 500 mL   LHR 16 resp/min   Peep/cpap 5.0 cm H20   Inspiratory PAP PENDING    Expiratory PAP PENDING    pH, Arterial 7.36 7.350 - 7.450   pCO2 arterial 34 32.0 - 48.0 mmHg   pO2, Arterial 336 (H) 83.0 - 108.0 mmHg   Bicarbonate 19.2 (L) 20.0 - 28.0 mmol/L   Acid-base deficit 5.5 (H) 0.0 - 2.0 mmol/L   O2 Saturation 99.9 %   Patient temperature 37.0    Oxygen index PENDING    Collection site RIGHT RADIAL    Sample type ARTERIAL DRAW    Allens test (pass/fail) PENDING PASS  Glucose, capillary     Status: Abnormal   Collection Time: 11/21/16 10:00 PM  Result Value Ref Range   Glucose-Capillary 154 (H) 65 - 99 mg/dL  MRSA PCR Screening     Status: None   Collection Time: 11/21/16 10:03 PM  Result Value Ref Range   MRSA by PCR NEGATIVE NEGATIVE    SUBJECTIVE:  No acute events last night.  Transferred to ICU without incident.  OBJECTIVE:   GEN-  Sedated but opens eyes to name NECK-  ETT in place in anterior neck and secure, dressing removed and small amount of bleeding, dressing  replaced.  Improved submental and anterior neck edema OC/OP- improved tongue and floor of mouth edema   IMPRESSION:  S/p Emergent bedside trach in ED  PLAN:  To OR this afternoon for trach revision.  Discussed with wife.  Most likely will place size 6 cuffed shiley.  Ethan Mills 11/22/2016, 7:25 AM

## 2016-11-22 NOTE — Progress Notes (Signed)
All sedation was turned off, pt awakened soon after.  Writer called RT, who disconnected vent and placed collar at 28%.  No s/sx distress; NG to LIS, pt is alert and nods and shakes head appropriately, family at bedside, UOP WDL, VSS.  bair hugger in place for core temp < 36.5

## 2016-11-22 NOTE — Progress Notes (Signed)
Patient transferred to OR for trach revision.  Writer and RT accompanied

## 2016-11-22 NOTE — Progress Notes (Signed)
Initial Nutrition Assessment  DOCUMENTATION CODES:   Not applicable  INTERVENTION:  -If pt remains on vent support for >24-48 hours, recommend initiaiton of nutrition support via TF  NUTRITION DIAGNOSIS:   Inadequate oral intake related to acute illness as evidenced by NPO status.  GOAL:   Patient will meet greater than or equal to 90% of their needs  MONITOR:   Vent status, Labs, Weight trends  REASON FOR ASSESSMENT:   Ventilator   ASSESSMENT:   70 yo male admitted with acute respiratory failure related to angioedema s/p emergent trach at bedside   Plan to go to OR today for trach revision vs extubation Patient is currently on ventilator support MV: 8.1 L/min Temp (24hrs), Avg:98.1 F (36.7 C), Min:97.5 F (36.4 C), Max:99 F (37.2 C)  Labs: reviewed Meds: LR at 75 ml/hr, solumedrol, diprivan/fentanyl  Diet Order:  Diet NPO time specified  Skin:  Reviewed, no issues  Last BM:  no BM documented  Height:   Ht Readings from Last 1 Encounters:  11/21/16 5\' 8"  (1.727 m)    Weight:   Wt Readings from Last 1 Encounters:  11/21/16 192 lb (87.1 kg)    BMI:  Body mass index is 29.19 kg/m.  Estimated Nutritional Needs:   Kcal:  [id number] kcals  Protein:  104-131 g  Fluid:  >/= 1.8 L  EDUCATION NEEDS:   No education needs identified at this time  [place], [place], [place] [phone] Pager  [phone] Weekend/On-Call Pager

## 2016-11-22 NOTE — Transfer of Care (Signed)
Immediate Anesthesia Transfer of Care Note  Patient: Ethan Mills  Procedure(s) Performed: Procedure(s): TRACHEOSTOMY (N/A)  Patient Location: ICU  Anesthesia Type:General  Level of Consciousness: awake  Airway & Oxygen Therapy: Patient placed on Ventilator (see vital sign flow sheet for setting)  Post-op Assessment: Report given to RN and Post -op Vital signs reviewed and stable  Post vital signs: Reviewed and stable  Last Vitals:  Vitals:   11/22/16 1100 11/22/16 1200  BP: (!) 90/59 95/61  Pulse: 69 66  Resp: 16 16  Temp: 36.7 C 36.5 C    Last Pain:  Vitals:   11/22/16 1200  TempSrc: Core (Comment)         Complications: No apparent anesthesia complications

## 2016-11-22 NOTE — Op Note (Signed)
..  11/22/2016 1:47 PM  Lynelle Smoke [id number]  Pre-Op Dx: s/p emergent cricothyroidotomy for angioedema  Post-Op Dx:  same  Proc:  ConversionTracheostomy  Surg:  Nehemiah Montee   Assist:  Anda Latina  Anes:  GOT  EBL:  55ml  Comp:  none  Findings:  After severe neck edema had improved, patient taken to OR for evaluation of previous emergent airway placement.  Placement was noted to be within the cricothyroid membrane and was converted to traditional tracheostomy between tracheal rings 2 and 3.  Procedure:  The patient was brought from the intensive care unit to the operating room and transferred to an operating table.  Anesthesia was administered per indwelling ETT located within the patient's anterior neck.  Neck extension was achieved as possible and a shoulder role was placed.  The lower neck was palpated with the findings as described above.  The patient was prepped in a sterile fashion with a surgical prep from the chin down to the upper chest.  Sterile draping was accomplished in the standard fashion.  At this time, the patient's neck was evaluated and the vertical incision extended slightly inferior.  Residual strap muscles were divided at the midline using Harmonic Scalpel.  The thyroid gland was palpated as well as cricoid.  The previous placed emergent ETT was noted to be within the cricothyroid membrane.  The pretracheal plane was visualized.  This was entered bluntly.  The thyroid isthmus was isolated and divided with the Harmonic scalpel.  The thyroid gland was retracted to either side.  The anterior face of the trachea was cleared.  In the  2nd and 3rd interspace, a transverse incision was made between cartilage rings into the tracheal lumen.  A previously tested  # 6 Shiley cuffed tracheostomy tube was brought into the field.  With the endotracheal tube under direct visualization through the tracheostomy, it was gently backed up.  The tracheostomy tube was inserted into  the tracheal lumen.  Hemostasis was observed. The cuff was inflated and observed to be intact and containing pressure. The inner cannula was placed and ventilation assumed per tracheostomy tube.  Good chest wall motion was observed, and CO2 was documented per anesthesia.  The trach tube was secured in the standard fashion with trach ties. A 2-0 Silk suture was used to secure the trach tube to the skin on both sides.  Hemostasis was observed again.  When satisfactory ventilation was assured, the ET tube was removed from the anterior neck.  At this point the procedure was completed.  The patient was returned to anesthesia, awakened as possible, and transferred back to the intensive care unit in stable condition.  Comment: 70 y.o. male with need for acute airway intervention due to angioedema and s/p cricothyroidotomy.  Anticipate a routine postoperative recovery including standard tracheal hygiene.  The sutures should be removed in 5 days.  When the patient no longer requires ventilator or pressure support, the cuff should be deflated.  Will evaluate on POD #5.   Bryttney Netzer  1:47 PM 11/22/2016

## 2016-11-22 NOTE — Progress Notes (Signed)
Inpatient Diabetes Program Recommendations  AACE/ADA: New Consensus Statement on Inpatient Glycemic Control (2015)  Target Ranges:  Prepandial:   less than 140 mg/dL      Peak postprandial:   less than 180 mg/dL (1-2 hours)      Critically ill patients:  140 - 180 mg/dL   Lab Results  Component Value Date   GLUCAP 154 (H) 11/21/2016    Review of Glycemic Control  Results for DEXTEN, Ethan Mills (MRN [id number]) as of 11/22/2016 12:36  Ref. Range 11/21/2016 22:00  Glucose-Capillary Latest Ref Range: 65 - 99 mg/dL 154 (H)    Diabetes history: Type 2 Outpatient Diabetes medications: Metformin 500mg  bid  * steroids Current orders for Inpatient glycemic control: none  Inpatient Diabetes Program Recommendations:  Please consider ordering CBG and Novolog moderate correction 0-15 units q4h while NPO.   Per ADA recommendations "consider performing an A1C on all patients with diabetes or hyperglycemia admitted to the hospital if not performed in the prior 3 months".   Gentry Fitz, RN, BA, MHA, CDE Diabetes Coordinator Inpatient Diabetes Program  [phone] (Team Pager) [phone] ([place]) 11/22/2016 12:38 PM

## 2016-11-22 NOTE — Anesthesia Preprocedure Evaluation (Signed)
Anesthesia Evaluation  Patient identified by MRN, date of birth, ID band Patient awake    Reviewed: Allergy & Precautions, NPO status , Patient's Chart, lab work & pertinent test results, reviewed documented beta blocker date and time   Airway Mallampati: III  TM Distance: >3 FB     Dental  (+) Chipped   Pulmonary former smoker,           Cardiovascular hypertension, Pt. on medications      Neuro/Psych    GI/Hepatic   Endo/Other  diabetes, Type 2  Renal/GU      Musculoskeletal   Abdominal   Peds  Hematology   Anesthesia Other Findings Resp failure with trach in ER.  Reproductive/Obstetrics                             Anesthesia Physical Anesthesia Plan  ASA: IV  Anesthesia Plan: General   Post-op Pain Management:    Induction: Intravenous  Airway Management Planned:   Additional Equipment:   Intra-op Plan:   Post-operative Plan:   Informed Consent: I have reviewed the patients History and Physical, chart, labs and discussed the procedure including the risks, benefits and alternatives for the proposed anesthesia with the patient or authorized representative who has indicated his/her understanding and acceptance.     Plan Discussed with: CRNA  Anesthesia Plan Comments:         Anesthesia Quick Evaluation

## 2016-11-22 NOTE — Anesthesia Procedure Notes (Signed)
Procedure Name: Intubation Date/Time: 11/22/2016 1:16 PM Performed by: Marsh Dolly Pre-anesthesia Checklist: Patient identified Patient Re-evaluated:Patient Re-evaluated prior to inductionOxygen Delivery Method: Circle system utilized Intubation Type: Inhalational induction and Tracheostomy Ventilation: Mask ventilation without difficulty Tube size: 7.0 mm Number of attempts: 1 Placement Confirmation: breath sounds checked- equal and bilateral Secured at: 0 cm Tube secured with: Tape Dental Injury: Teeth and Oropharynx as per pre-operative assessment

## 2016-11-22 NOTE — Progress Notes (Signed)
Patient became very agitated this AM shortly after 0800 AM assessment.  Eyes were very wide and he was sitting up in the bed and reaching for ETT.  Propofol and Fentanyl adjusted and patient has been resting quietly since then, awaiting transfer to OR for trach revision

## 2016-11-22 NOTE — Progress Notes (Signed)
PULMONARY / CRITICAL CARE MEDICINE   Name: Ethan Mills MRN: [id number] DOB: 1946/04/02    ADMISSION DATE:  11/21/2016 CONSULTATION DATE:  11/21/16  REFERRING MD:  Dr. Jeannie Fend  CHIEF COMPLAINT:  Acute respiratory Failure due to Angioedema  HISTORY OF PRESENT ILLNESS:   Ethan Mills is 70 year old male with no medical history on file. History was obtained from his wife present at bedside.  Wife states that patient went out to eat some seafood and came home with swelling of the throat and unable to speak.  Wife also states that he  Takes lisinopril for his high blood pressure.  He had swelling of his throat before but wife states that "it was not this bad".  Patient presented to ED with acute respiratory distress. Due to impending Respiratory Failure, ED physician attempted to intubate with Glyde laryngoscope without success.  Therefore ENT was called and had emergently trached at the bedside.  PCCM team was called to admit the patient.  PAST MEDICAL HISTORY :  He  has a past medical history of Cancer ([place]); Diabetes mellitus without complication ([place]); and Hypertension.  PAST SURGICAL HISTORY: He  has no past surgical history on file.  Allergies  Allergen Reactions  . Lisinopril Anaphylaxis  . Shellfish Allergy Anaphylaxis    No current facility-administered medications on file prior to encounter.    No current outpatient prescriptions on file prior to encounter.    FAMILY HISTORY:  His has no family status information on file.    SOCIAL HISTORY:  REVIEW OF SYSTEMS:   Unable to obtain as the patient is sedated, trached and mechanically ventilated  SUBJECTIVE:  Unable to obtain as the patient is sedated ,trached and mechanically ventilated.   VITAL SIGNS: BP 95/61 (BP Location: Left Arm)   Pulse 66   Temp 97.7 F (36.5 C) (Core (Comment))   Resp 16   Ht 5\' 8"  ([id number] m)   Wt 192 lb (87.1 kg)   SpO2 100%   BMI 29.19 kg/m   HEMODYNAMICS:    VENTILATOR  SETTINGS: Vent Mode: PRVC FiO2 (%):  [30 %-100 %] 30 % Set Rate:  [16 bmp] 16 bmp Vt Set:  [500 mL] 500 mL PEEP:  [5 cmH20] 5 cmH20 Plateau Pressure:  [21 cmH20] 21 cmH20  INTAKE / OUTPUT: I/O last 3 completed shifts: In: 672.8 [I.V.:672.8] Out: 800 [Urine:800]  PHYSICAL EXAMINATION: General:  AA male , trached with ET tube  Neuro:  Sedated  HEENT:  Atraumatic, normocephalic, severe swelling of the tongue, jaw and neck Cardiovascular: Tachycardic.S1S2, no MRG noted  Lungs:  Clear bilaterally , no wheezes, crackles or rhonchi noted Abdomen:  Soft, non tender, active bowel sounds Musculoskeletal:  No inflammation/deformity noted Skin:  Grossly intact  LABS:  BMET  Recent Labs Lab 11/21/16 1927 11/22/16 0641  NA 137 137  K 3.4* 4.2  CL 108 109  CO2 20* 20*  BUN 13 13  CREATININE 0.71 0.78  GLUCOSE 259* 180*    Electrolytes  Recent Labs Lab 11/21/16 1927 11/22/16 0641  CALCIUM 8.9 8.3*  MG  --  1.5*  PHOS  --  3.1    CBC  Recent Labs Lab 11/21/16 1927 11/22/16 0641  WBC 8.9 11.4*  HGB 11.6* 10.1*  HCT 36.4* 31.6*  PLT 257 219    Coag's  Recent Labs Lab 11/21/16 1927  INR 1.03    Sepsis Markers No results for input(s): LATICACIDVEN, PROCALCITON, O2SATVEN in the last 168 hours.  ABG  Recent  Labs Lab 11/21/16 2041  PHART 7.36  PCO2ART 34  PO2ART 336*    Liver Enzymes  Recent Labs Lab 11/21/16 1927  AST 27  ALT 20  ALKPHOS 61  BILITOT 0.4  ALBUMIN 3.5    Cardiac Enzymes  Recent Labs Lab 11/21/16 1927  TROPONINI <0.03    Glucose  Recent Labs Lab 11/21/16 2200  GLUCAP 154*    Imaging No results found.   STUDIES:  none  CULTURES: None   ANTIBIOTICS: Ampicillin Salbactam X1 dose  SIGNIFICANT EVENTS: 11/21/16>>  Patient presented to the Ed with Acute respiratory distress related to angioedema requiring emergent trach 12/21 Emergent Trach  LINES/TUBES: none   ASSESSMENT /  PLAN:  PULMONARY A: Acute Respiratory failure related to Angioedema possibly due to lisinopril E P:   Full vent support Keep the patient heavily sedated , RASS -4 TO -5 Propofol/ fentanyl  For sedation Continue solumedrol Continue Benadryl Continue Famotidine  CARDIOVASCULAR A:  No active issues P:  Continuous telemetry Will start pressors to keep MAP goals>65 if needed  RENAL A:   No active issues P:   Follow chemistry Replace electrolytes per usual guidelines  GASTROINTESTINAL A:   No active issues P:   Will keep NPO HEMATOLOGIC A:   No active issues P:  Heparin for DVT prophylaxis Transfuse  If Hgb<7  INFECTIOUS A:   No active issues P:   Received Ampicillin Sulbactam X 1 for emergent Trach Follow CBC intermittently  ENDOCRINE A:   No active issues P:   BG checks intermittently with BMP  NEUROLOGIC A:   No active issues P:    Maintain RASS goal: of -4 to -5 Will get Re-evaluated by ENT on 12/22    Bincy Varughese,AG-ACNP Pulmonary and [place]   11/22/2016, 1:24 PM   PCCM ATTENDING ATTESTATION:  I have evaluated patient with the APP Varughese, reviewed database in its entirety and discussed care plan in detail. In addition, this patient was discussed on multidisciplinary rounds.   Important exam findings: RASS -4 on propofol #6 ETT through a trach stoma No overt facial edema, tongue is a little generous but not overtly enlarged No wheezes Reg, no M Abd soft, NT No edema  Major problems addressed by PCCM team: Laryngeal edema with airway compromise S/P emergent trach   PLAN/REC: Meds reviewed and modified For eval in OR by ENT and possible trach revision He should be able to be off vent after he returns from OR Mgmt of airway issues per ENT    Merton Border, MD PCCM service Mobile [phone] Pager [phone]

## 2016-11-22 NOTE — H&P (Deleted)
PULMONARY / CRITICAL CARE MEDICINE   Name: Ethan Mills MRN: [id number] DOB: Jul 25, 1946    ADMISSION DATE:  11/21/2016 CONSULTATION DATE:  11/21/16  REFERRING MD:  Dr. Jeannie Fend  CHIEF COMPLAINT:  Acute respiratory Failure due to Angioedema  HISTORY OF PRESENT ILLNESS:   Ethan Mills is 70 year old male with no medical history on file. History was obtained from his wife present at bedside.  Wife states that patient went out to eat some seafood and came home with swelling of the throat and unable to speak.  Wife also states that he  Takes lisinopril for his high blood pressure.  He had swelling of his throat before but wife states that "it was not this bad".  Patient presented to ED with acute respiratory distress. Due to impending Respiratory Failure, ED physician attempted to intubate with Glyde laryngoscope without success.  Therefore ENT was called and had emergently trached at the bedside.  PCCM team was called to admit the patient.  PAST MEDICAL HISTORY :  He  has a past medical history of Cancer ([place]); Diabetes mellitus without complication ([place]); and Hypertension.  PAST SURGICAL HISTORY: He  has no past surgical history on file.  Allergies  Allergen Reactions  . Lisinopril Anaphylaxis  . Shellfish Allergy Anaphylaxis    No current facility-administered medications on file prior to encounter.    No current outpatient prescriptions on file prior to encounter.    FAMILY HISTORY:  His has no family status information on file.    SOCIAL HISTORY:  REVIEW OF SYSTEMS:   Unable to obtain as the patient is sedated, trached and mechanically ventilated  SUBJECTIVE:  Unable to obtain as the patient is sedated ,trached and mechanically ventilated.   VITAL SIGNS: BP 95/61 (BP Location: Left Arm)   Pulse 66   Temp 97.7 F (36.5 C) (Core (Comment))   Resp 16   Ht 5\' 8"  (1.727 m)   Wt 192 lb (87.1 kg)   SpO2 100%   BMI 29.19 kg/m   HEMODYNAMICS:    VENTILATOR  SETTINGS: Vent Mode: PRVC FiO2 (%):  [30 %-100 %] 30 % Set Rate:  [16 bmp] 16 bmp Vt Set:  [500 mL] 500 mL PEEP:  [5 cmH20] 5 cmH20 Plateau Pressure:  [21 cmH20] 21 cmH20  INTAKE / OUTPUT: I/O last 3 completed shifts: In: 672.8 [I.V.:672.8] Out: 800 [Urine:800]  PHYSICAL EXAMINATION: General:  AA male , trached with ET tube  Neuro:  Sedated  HEENT:  Atraumatic, normocephalic, severe swelling of the tongue, jaw and neck Cardiovascular: Tachycardic.S1S2, no MRG noted  Lungs:  Clear bilaterally , no wheezes, crackles or rhonchi noted Abdomen:  Soft, non tender, active bowel sounds Musculoskeletal:  No inflammation/deformity noted Skin:  Grossly intact  LABS:  BMET  Recent Labs Lab 11/21/16 1927 11/22/16 0641  NA 137 137  K 3.4* 4.2  CL 108 109  CO2 20* 20*  BUN 13 13  CREATININE 0.71 0.78  GLUCOSE 259* 180*    Electrolytes  Recent Labs Lab 11/21/16 1927 11/22/16 0641  CALCIUM 8.9 8.3*  MG  --  1.5*  PHOS  --  3.1    CBC  Recent Labs Lab 11/21/16 1927 11/22/16 0641  WBC 8.9 11.4*  HGB 11.6* 10.1*  HCT 36.4* 31.6*  PLT 257 219    Coag's  Recent Labs Lab 11/21/16 1927  INR 1.03    Sepsis Markers No results for input(s): LATICACIDVEN, PROCALCITON, O2SATVEN in the last 168 hours.  ABG  Recent  Labs Lab 11/21/16 2041  PHART 7.36  PCO2ART 34  PO2ART 336*    Liver Enzymes  Recent Labs Lab 11/21/16 1927  AST 27  ALT 20  ALKPHOS 61  BILITOT 0.4  ALBUMIN 3.5    Cardiac Enzymes  Recent Labs Lab 11/21/16 1927  TROPONINI <0.03    Glucose  Recent Labs Lab 11/21/16 2200  GLUCAP 154*    Imaging No results found.   STUDIES:  none  CULTURES: None   ANTIBIOTICS: Ampicillin Salbactam X1 dose  SIGNIFICANT EVENTS: 11/21/16>>  Patient presented to the Ed with Acute respiratory distress related to angioedema requiring emergent trach 12/21 Emergent Trach  LINES/TUBES: none   ASSESSMENT /  PLAN:  PULMONARY A: Acute Respiratory failure related to Angioedema possibly due to lisinopril E P:   Full vent support Keep the patient heavily sedated , RASS -4 TO -5 Propofol/ fentanyl  For sedation Continue solumedrol Continue Benadryl Continue Famotidine  CARDIOVASCULAR A:  No active issues P:  Continuous telemetry Will start pressors to keep MAP goals>65 if needed  RENAL A:   No active issues P:   Follow chemistry Replace electrolytes per usual guidelines  GASTROINTESTINAL A:   No active issues P:   Will keep NPO HEMATOLOGIC A:   No active issues P:  Heparin for DVT prophylaxis Transfuse  If Hgb<7  INFECTIOUS A:   No active issues P:   Received Ampicillin Sulbactam X 1 for emergent Trach Follow CBC intermittently  ENDOCRINE A:   No active issues P:   BG checks intermittently with BMP  NEUROLOGIC A:   No active issues P:    Maintain RASS goal: of -4 to -5 Will get Re-evaluated by ENT on 12/22    Bincy Varughese,AG-ACNP Pulmonary and [place]   11/22/2016, 1:20 PM   PCCM ATTENDING ATTESTATION:  I have evaluated patient with the APP Varughese, reviewed database in its entirety and discussed care plan in detail. In addition, this patient was discussed on multidisciplinary rounds.   Important exam findings: RASS -4 on propofol #6 ETT through a trach stoma No overt facial edema, tongue is a little generous but not overtly enlarged No wheezes Reg, no M Abd soft, NT No edema  Major problems addressed by PCCM team: Laryngeal edema with airway compromise S/P emergent trach   PLAN/REC: Meds reviewed and modified For eval in OR by ENT and possible trach revision He should be able to be off vent after he returns from OR Mgmt of airway issues per ENT    Merton Border, MD PCCM service Mobile [phone] Pager [phone]

## 2016-11-22 NOTE — Anesthesia Postprocedure Evaluation (Signed)
Anesthesia Post Note  Patient: Ethan Mills  Procedure(s) Performed: Procedure(s) (LRB): TRACHEOSTOMY (N/A)  Patient location during evaluation: PACU Anesthesia Type: General Level of consciousness: awake and alert Pain management: pain level controlled Vital Signs Assessment: post-procedure vital signs reviewed and stable Respiratory status: spontaneous breathing, nonlabored ventilation, respiratory function stable and patient connected to nasal cannula oxygen Cardiovascular status: blood pressure returned to baseline and stable Postop Assessment: no signs of nausea or vomiting Anesthetic complications: no     Last Vitals:  Vitals:   11/22/16 1500 11/22/16 1600  BP: 103/68 133/80  Pulse: 82 86  Resp: 12 (!) 9  Temp: 36.1 C 36.4 C    Last Pain:  Vitals:   11/22/16 1600  TempSrc: Core (Comment)                 Moesha Sarchet S

## 2016-11-23 ENCOUNTER — Inpatient Hospital Stay: Payer: Medicare Other

## 2016-11-23 ENCOUNTER — Encounter: Payer: Self-pay | Admitting: Otolaryngology

## 2016-11-23 DIAGNOSIS — T783XXD Angioneurotic edema, subsequent encounter: Secondary | ICD-10-CM

## 2016-11-23 DIAGNOSIS — I10 Essential (primary) hypertension: Secondary | ICD-10-CM

## 2016-11-23 LAB — GLUCOSE, CAPILLARY
GLUCOSE-CAPILLARY: 146 mg/dL — AB (ref 65–99)
GLUCOSE-CAPILLARY: 187 mg/dL — AB (ref 65–99)
Glucose-Capillary: 124 mg/dL — ABNORMAL HIGH (ref 65–99)

## 2016-11-23 MED ORDER — HYDRALAZINE HCL 20 MG/ML IJ SOLN: 10.0000 mg | INTRAMUSCULAR | Status: DC | PRN

## 2016-11-23 MED ORDER — ENOXAPARIN SODIUM 40 MG/0.4ML ~~LOC~~ SOLN
40.0000 mg | SUBCUTANEOUS | Status: DC
  Administered 2016-11-23 – 2016-11-28 (×6): 40 mg via SUBCUTANEOUS
  Filled 2016-11-23 (×5): qty 0.4

## 2016-11-23 MED ORDER — AMLODIPINE BESYLATE 10 MG PO TABS
10.0000 mg | ORAL_TABLET | Freq: Every day | ORAL | Status: DC
  Administered 2016-11-23 – 2016-11-29 (×7): 10 mg via ORAL
  Filled 2016-11-23 (×7): qty 1

## 2016-11-23 NOTE — Progress Notes (Signed)
Pt. Remained on TC @ 28% O/N with O2 Sats > 95%.  Pt. Able to cough up moderate secretions  Into mouth and out trach without requiring suction.  Pt. A&O, nodding appropriately and following commands.  NGT @ 68 to ILWS-output of 130. UOP average 200 mL/h- will discuss with day shift RN about d/c'ing foley. Pt.'s SBP >160, gave hydralazine PRN x 1. VSS, no other issues O/N.  Report given to Sarah.

## 2016-11-23 NOTE — Progress Notes (Signed)
RN spoke with Dr. Linna Darner (ENT MD on call) about obtaining orders for tracheostomy care and inner cannula change. MD ordered tracheostomy care and inner cannula change as needed. Team will continue to monitor.

## 2016-11-23 NOTE — Evaluation (Signed)
Clinical/Bedside Swallow Evaluation Patient Details  Name: Ethan Mills MRN: [id number] Date of Birth: 12-21-1945  Today's Date: 11/23/2016 Time: SLP Start Time (ACUTE ONLY): 34 SLP Stop Time (ACUTE ONLY): 1230 SLP Time Calculation (min) (ACUTE ONLY): 60 min  Past Medical History:  Past Medical History:  Diagnosis Date  . Cancer [facility])    prostate  . Diabetes mellitus without complication ([place])   . Hypertension    Past Surgical History:  Past Surgical History:  Procedure Laterality Date  . TRACHEOSTOMY TUBE PLACEMENT N/A 11/22/2016   Procedure: TRACHEOSTOMY;  Surgeon: Carloyn Manner, MD;  Location: [facility] ORS;  Service: ENT;  Laterality: N/A;   HPI:  Pt is 69 year old male with no medical history on file. History was obtained from his wife present at bedside.  Wife states that patient went out to eat some seafood and came home with swelling of the throat and unable to speak.  Wife also states that he takes Lisinopril for his high blood pressure.  He had swelling of his throat before but wife states that "it was not this bad".  Patient presented to ED with acute respiratory distress. Due to impending Respiratory Failure, ED physician attempted to intubate with Glyde laryngoscope without success.  Therefore ENT was called and had emergently trached at the bedside.  PCCM team was called to admit the patient. Currently, pt is resting in bed w/ trach collar for O2 support at trach level - trach cuff deflated just prior to this evaluation. NG+ for meds and nutritional support. Pt is alert/oriented and follows through w/ instructions from SLP. Mostly nonverbal but phonating some and nodding head/gesturing for communication.   Assessment / Plan / Recommendation Clinical Impression  Pt presents w/ tracheostomy (emergent placement d/t edema and airway compromise); NG+ currently. He appeared to adequately tolerate small trials of thin liquids via CUP only and puree w/ no immediate, overt s/s of  aspiration noted. There was no decline in vocal quality(phonations) post trials when assessed; no decline or changes in RR/effort or O2 sats(remained b/t 99-100%) during/post trials. Oral phase appeared wfl for bolus management and clearing of trials. Pt was educated on using f/u, dry swallowing to reduce any secretions and/or residue b/t trials which he followed through w/ appropriately. Recommend initiation of a Pureed diet w/ thin liquids; meds given Crushed in a puree for easier swallowing at this time; Aspiration precautions. Recommend trials to upgrade diet consistency as secretions and edema improve; pulmonary status improves. NSG indicated NG would be removed today which would also allow for consideration of diet upgrade to increased textures; cautioned pt on dry, rough foods and particulates initially. Pt agreed. Per MD note, it is not expected that pt will continue to have the tracheostomy once the edema has reduced/Pulmonary status improved and trach can be removed. Will f/u w/ a PMV evaluation if trach remains placed for any length of time. Recommended finger occlusion for brief verbal communications.     Aspiration Risk   (reduced following aspiration precautions)    Diet Recommendation  Dysphagia level 1(puree) w/ Thin liquids; aspiration precautions; moistened foods.   Medication Administration: Crushed with puree for easier swallowing at this time; or in liquid form.    Other  Recommendations Recommended Consults:  (TBD) Oral Care Recommendations: Oral care BID;Staff/trained caregiver to provide oral care   Follow up Recommendations  (TBD)      Frequency and Duration  (TBD)   (TBD)       Prognosis Prognosis for Safe Diet  Advancement: Good Barriers to Reach Goals:  (none)      Swallow Study   General Date of Onset: 11/21/16 HPI: Pt is 70 year old male with no medical history on file. History was obtained from his wife present at bedside.  Wife states that patient went out to  eat some seafood and came home with swelling of the throat and unable to speak.  Wife also states that he takes Lisinopril for his high blood pressure.  He had swelling of his throat before but wife states that "it was not this bad".  Patient presented to ED with acute respiratory distress. Due to impending Respiratory Failure, ED physician attempted to intubate with Glyde laryngoscope without success.  Therefore ENT was called and had emergently trached at the bedside.  PCCM team was called to admit the patient. Currently, pt is resting in bed w/ trach collar for O2 support at trach level - trach cuff deflated just prior to this evaluation. NG+ for meds and nutritional support. Pt is alert/oriented and follows through w/ instructions from SLP. Mostly nonverbal but phonating some and nodding head/gesturing for communication. Type of Study: Bedside Swallow Evaluation Previous Swallow Assessment: none indicated Diet Prior to this Study: Regular;Thin liquids (at home) Temperature Spikes Noted: No Respiratory Status: Trach Collar;Trach Trach Size and Type: #6;Cuff (shiley) History of Recent Intubation: No (tracheostomy) Behavior/Cognition: Alert;Cooperative;Pleasant mood (nonverbal for the most part d/t trach) Oral Cavity Assessment: Within Functional Limits Oral Care Completed by SLP: Recent completion by staff Oral Cavity - Dentition: Adequate natural dentition Vision: Functional for self-feeding Self-Feeding Abilities: Able to feed self Patient Positioning: Upright in bed Baseline Vocal Quality:  (reduced d/t presence of tracheostomy) Volitional Cough: Strong;Congested Volitional Swallow: Able to elicit    Oral/Motor/Sensory Function Overall Oral Motor/Sensory Function: Within functional limits   Ice Chips Ice chips: Within functional limits Presentation: Spoon;Self Fed (fed; 5 trials)   Thin Liquid Thin Liquid: Within functional limits Presentation: Cup;Self Fed (8 trials)    Nectar Thick  Nectar Thick Liquid: Not tested   Honey Thick Honey Thick Liquid: Not tested   Puree Puree: Within functional limits Presentation: Self Fed;Spoon (5 trials)   Solid   GO   Solid: Not tested Other Comments: trials to upgrade in the next few days as edema and secretions lessen         Orinda Kenner, [place], CCC-SLP Watson,Katherine 11/23/2016,1:20 PM

## 2016-11-23 NOTE — Plan of Care (Signed)
Problem: SLP Dysphagia Goals Goal: Misc Dysphagia Goal Pt will safely tolerate po diet of least restrictive consistency w/ no overt s/s of aspiration noted by Staff/pt/family x3 sessions.    

## 2016-11-23 NOTE — Progress Notes (Signed)
S/P trach revision 12/22. Now off vent on ATC. No distress. Communicates no new complaints  Vitals:   11/23/16 0810 11/23/16 0900 11/23/16 1000 11/23/16 1100  BP: (!) 161/91 (!) 157/85 (!) 155/88 (!) 156/91  Pulse: 97 (!) 103 97 (!) 105  Resp: (!) 21 (!) 23 15 18   Temp: 98.3 F (36.8 C)     TempSrc: Oral     SpO2: 100% 100% 100% 100%  Weight:      Height:        NAD HEENT WNL, NGT present Trach tube present - site clean Chest clear Reg, no M NABS, soft Ext without edema  BMP Latest Ref Rng & Units 11/22/2016 11/21/2016  Glucose 65 - 99 mg/dL 180(H) 259(H)  BUN 6 - 20 mg/dL 13 13  Creatinine 0.61 - 1.24 mg/dL 0.78 0.71  Sodium 135 - 145 mmol/L 137 137  Potassium 3.5 - 5.1 mmol/L 4.2 3.4(L)  Chloride 101 - 111 mmol/L 109 108  CO2 22 - 32 mmol/L 20(L) 20(L)  Calcium 8.9 - 10.3 mg/dL 8.3(L) 8.9   CBC Latest Ref Rng & Units 11/22/2016 11/21/2016  WBC 3.8 - 10.6 K/uL 11.4(H) 8.9  Hemoglobin 13.0 - 18.0 g/dL 10.1(L) 11.6(L)  Hematocrit 40.0 - 52.0 % 31.6(L) 36.4(L)  Platelets 150 - 440 K/uL 219 257   CXR: NACPD  IMPRESSION: Acute ventilatory failure due to laryngeal obstruction/angioedema  Inciting event is either ACEI or shellfish S/P tracheostomy tube placement Hypertension  PLAN/REC: SLP swallow eval - if passes, DC NGT and begin diet Advance activity Trach tube mgmt per ENT Begin amlodipine PRN Hydralazine Transfer to med-surg floor and [employer] (discussed with Dr Manuella Ghazi) PCCM will sign off as of AM 12/24. Please call if we can be of further assistance   Merton Border, MD PCCM service Mobile [phone] Pager [phone] 11/23/2016

## 2016-11-23 NOTE — Progress Notes (Signed)
Report called to [place] on [place]. Patient to move to room 225. Paitent and girlfriend at bedside aware of transfer. Belongings sent with bed or with girlfriend. Patient's NG removed per orders since patient approved for diet by speech therapy. Patient alert and oriented, O2 sats 100% on 28 % tracheostomy collar. Inner cannula and gauze dressing at site changed per orders and site care done per orders due to soiling. Patient had foley catheter removed at 08:00 and has voided 225 of urine since removal into urinal. Patient able to scoot himself to his new bed without assistance.

## 2016-11-23 NOTE — Progress Notes (Signed)
Pt accepted from Dr Alva Garnet. Hospitalist will pick him tomorrow at 7 am to resume care.

## 2016-11-24 LAB — BLOOD GAS, ARTERIAL
Acid-base deficit: 5.5 mmol/L — ABNORMAL HIGH (ref 0.0–2.0)
Bicarbonate: 19.2 mmol/L — ABNORMAL LOW (ref 20.0–28.0)
FIO2: 1
LHR: 16 {breaths}/min
MECHVT: 500 mL
O2 SAT: 99.9 %
PCO2 ART: 34 mmHg (ref 32.0–48.0)
PEEP: 5 cmH2O
PO2 ART: 336 mmHg — AB (ref 83.0–108.0)
Patient temperature: 37
pH, Arterial: 7.36 (ref 7.350–7.450)

## 2016-11-24 MED ORDER — SENNA 8.6 MG PO TABS
1.0000 | ORAL_TABLET | Freq: Every day | ORAL | Status: DC
  Administered 2016-11-24 – 2016-11-27 (×4): 8.6 mg via ORAL
  Filled 2016-11-24 (×5): qty 1

## 2016-11-24 NOTE — Progress Notes (Signed)
..   11/24/2016 10:04 AM  Lynelle Smoke [id number]  Post-Op Day 3,2    Temp:  [98.2 F (36.8 C)-98.5 F (36.9 C)] 98.2 F (36.8 C) (12/24 0534) Pulse Rate:  [94-105] 97 (12/24 0534) Resp:  [18-23] 19 (12/24 0700) BP: (142-156)/(78-91) 144/91 (12/24 0534) SpO2:  [98 %-100 %] 99 % (12/24 0534) FiO2 (%):  [28 %] 28 % (12/23 2300),     Intake/Output Summary (Last 24 hours) at 11/24/16 1004 Last data filed at 11/24/16 1003  Gross per 24 hour  Intake              821 ml  Output             2225 ml  Net            -1404 ml    Results for orders placed or performed during the hospital encounter of 11/21/16 (from the past 24 hour(s))  Glucose, capillary     Status: Abnormal   Collection Time: 11/23/16  4:39 PM  Result Value Ref Range   Glucose-Capillary 187 (H) 65 - 99 mg/dL   Comment 1 Notify RN     SUBJECTIVE:  No acute events.  Transferred to floor.  Tolerating clear liquids  OBJECTIVE:   GEN-  NAD sitting upright in bed NECK-  Trach in place and secure on trach collar, minimal secretions.  Cuff deflated  IMPRESSION:  S/p emergent trach for angioedema  PLAN:  Remove sutures on pod#5.  Not tolerating finger occlusion today, will retest prior to trach change.  If continues to not tolerate finger occlusion, may change to 6 uncuffed shiley on POD#5.  Ashar Lewinski 11/24/2016, 10:04 AM

## 2016-11-24 NOTE — Progress Notes (Signed)
[place] [facility] [place] NAME: Ethan Mills    MR#:  [id number]  DATE OF BIRTH:  [birth year]  SUBJECTIVE:  Doing well no issues over night   REVIEW OF SYSTEMS:    Review of Systems  Constitutional: Negative.  Negative for chills, fever and malaise/fatigue.  HENT: Negative.  Negative for ear discharge, ear pain, hearing loss, nosebleeds and sore throat.   Eyes: Negative.  Negative for blurred vision and pain.  Respiratory: Negative.  Negative for cough, hemoptysis, shortness of breath and wheezing.   Cardiovascular: Negative.  Negative for chest pain, palpitations and leg swelling.  Gastrointestinal: Negative.  Negative for abdominal pain, blood in stool, diarrhea, nausea and vomiting.  Genitourinary: Negative.  Negative for dysuria.  Musculoskeletal: Negative.  Negative for back pain.  Skin: Negative.   Neurological: Negative for dizziness, tremors, speech change, focal weakness, seizures and headaches.  Endo/Heme/Allergies: Negative.  Does not bruise/bleed easily.  Psychiatric/Behavioral: Negative.  Negative for depression, hallucinations and suicidal ideas.    Tolerating Diet: yes      DRUG ALLERGIES:   Allergies  Allergen Reactions  . Lisinopril Anaphylaxis  . Shellfish Allergy Anaphylaxis    VITALS:  Blood pressure (!) 144/91, pulse 97, temperature 98.2 F (36.8 C), temperature source Oral, resp. rate 19, height 5\' 8"  (1.727 m), weight 81.3 kg (179 lb 3.7 oz), SpO2 99 %.  PHYSICAL EXAMINATION:   Physical Exam  Constitutional: He is oriented to person, place, and time and well-developed, well-nourished, and in no distress. No distress.  HENT:  Head: Normocephalic.  Mild tongue swelling  Eyes: No scleral icterus.  Neck: Normal range of motion. Neck supple. No JVD present. No tracheal deviation present.  Trach placed  Cardiovascular: Normal rate, regular rhythm and normal heart sounds.  Exam reveals no gallop and no friction  rub.   No murmur heard. Pulmonary/Chest: Effort normal and breath sounds normal. No stridor. No respiratory distress. He has no wheezes. He has no rales. He exhibits no tenderness.  Abdominal: Soft. Bowel sounds are normal. He exhibits no distension and no mass. There is no tenderness. There is no rebound and no guarding.  Musculoskeletal: Normal range of motion. He exhibits no edema.  Neurological: He is alert and oriented to person, place, and time.  Skin: Skin is warm. No rash noted. No erythema.  Psychiatric: Affect and judgment normal.      LABORATORY PANEL:   CBC  Recent Labs Lab 11/22/16 0641  WBC 11.4*  HGB 10.1*  HCT 31.6*  PLT 219   ------------------------------------------------------------------------------------------------------------------  Chemistries   Recent Labs Lab 11/21/16 1927 11/22/16 0641  NA 137 137  K 3.4* 4.2  CL 108 109  CO2 20* 20*  GLUCOSE 259* 180*  BUN 13 13  CREATININE 0.71 0.78  CALCIUM 8.9 8.3*  MG  --  1.5*  AST 27  --   ALT 20  --   ALKPHOS 61  --   BILITOT 0.4  --    ------------------------------------------------------------------------------------------------------------------  Cardiac Enzymes  Recent Labs Lab 11/21/16 1927  TROPONINI <0.03   ------------------------------------------------------------------------------------------------------------------  RADIOLOGY:  Dg Chest Port 1 View  Result Date: 11/23/2016 CLINICAL DATA:  Respiratory failure. EXAM: PORTABLE CHEST 1 VIEW COMPARISON:  None. FINDINGS: Tracheostomy tube appears in adequate position. Nasogastric tube is coiled once over the stomach with tip over the mid to distal stomach in the left upper quadrant. Lungs are adequately inflated with subtle blunting of the left costophrenic angle likely small amount  left pleural fluid versus pleuroparenchymal scarring. No lobar consolidation or pneumothorax. Cardiomediastinal silhouette is within normal. There is  minimal calcified plaque over the aortic arch. Remainder of the exam is within normal. IMPRESSION: Subtle blunting left costophrenic angle likely small amount of left pleural fluid versus pleuroparenchymal scarring. Tubes and lines as described. Electronically Signed   By: Marin Olp M.D.   On: 11/23/2016 07:12     ASSESSMENT AND PLAN:    70 year old male with a past medical history significant for hypertension. 8 a distal a seen which and shortly after  1. 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 Sutures to be removed 12/27 days with possible tracheostomy removal. Discussed with patient he should not eat seafood/fish and Ace inhibitors should be discontinued. He will need EpiPen at discharge Continue dysphagia 1 diet as per speech consult   2. Essential hypertension: Continue Norvasc When necessary hydralazine  3. Hyperglycemia: Check hemoglobin A1c  Management plans discussed with the patient and wife and they are in agreement.  CODE STATUS: full  TOTAL TIME TAKING CARE OF THIS PATIENT: 30 minutes.     POSSIBLE D/C 12/28, DEPENDING ON CLINICAL CONDITION.   Jasmyn Picha M.D on 11/24/2016 at 8:05 AM  Between 7am to 6pm - Pager - [phone] After 6pm go to [email or web address] - password [email or web address] [email or web address]  Office  [phone]  CC: Primary care physician; No PCP Per Patient  Note: This dictation was prepared with Dragon dictation along with smaller phrase technology. Any transcriptional errors that result from this process are unintentional.

## 2016-11-25 LAB — CREATININE, SERUM
CREATININE: 0.84 mg/dL (ref 0.61–1.24)
GFR calc Af Amer: >60 mL/min (ref >60)
GFR calc non Af Amer: >60 mL/min (ref >60)

## 2016-11-25 NOTE — Progress Notes (Signed)
[facility] NAME: Ethan Mills    MR#:  [id number]  DATE OF BIRTH:  Feb 20, 1946  SUBJECTIVE:  Patient well this morning   REVIEW OF SYSTEMS:    Review of Systems  Constitutional: Negative.  Negative for chills, fever and malaise/fatigue.  HENT: Negative.  Negative for ear discharge, ear pain, hearing loss, nosebleeds and sore throat.   Eyes: Negative.  Negative for blurred vision and pain.  Respiratory: Negative.  Negative for cough, hemoptysis, shortness of breath and wheezing.   Cardiovascular: Negative.  Negative for chest pain, palpitations and leg swelling.  Gastrointestinal: Negative.  Negative for abdominal pain, blood in stool, diarrhea, nausea and vomiting.  Genitourinary: Negative.  Negative for dysuria.  Musculoskeletal: Negative.  Negative for back pain.  Skin: Negative.   Neurological: Negative for dizziness, tremors, speech change, focal weakness, seizures and headaches.  Endo/Heme/Allergies: Negative.  Does not bruise/bleed easily.  Psychiatric/Behavioral: Negative.  Negative for depression, hallucinations and suicidal ideas.    Tolerating Diet: yes      DRUG ALLERGIES:   Allergies  Allergen Reactions  . Lisinopril Anaphylaxis  . Shellfish Allergy Anaphylaxis    VITALS:  Blood pressure 134/79, pulse 98, temperature 98.8 F (37.1 C), temperature source Oral, resp. rate 18, height 5\' 8"  (1.727 m), weight 81.3 kg (179 lb 3.7 oz), SpO2 100 %.  PHYSICAL EXAMINATION:   Physical Exam  Constitutional: He is oriented to person, place, and time and well-developed, well-nourished, and in no distress. No distress.  HENT:  Head: Normocephalic.  Eyes: No scleral icterus.  Neck: Normal range of motion. Neck supple. No JVD present. No tracheal deviation present.  Trach placed  Cardiovascular: Normal rate, regular rhythm and normal heart sounds.  Exam reveals no gallop and no friction rub.   No murmur  heard. Pulmonary/Chest: Effort normal and breath sounds normal. No stridor. No respiratory distress. He has no wheezes. He has no rales. He exhibits no tenderness.  Abdominal: Soft. Bowel sounds are normal. He exhibits no distension and no mass. There is no tenderness. There is no rebound and no guarding.  Musculoskeletal: Normal range of motion. He exhibits no edema.  Neurological: He is alert and oriented to person, place, and time.  Skin: Skin is warm. No rash noted. No erythema.  Psychiatric: Affect and judgment normal.      LABORATORY PANEL:   CBC  Recent Labs Lab 11/22/16 0641  WBC 11.4*  HGB 10.1*  HCT 31.6*  PLT 219   ------------------------------------------------------------------------------------------------------------------  Chemistries   Recent Labs Lab 11/21/16 1927 11/22/16 0641 11/25/16 0517  NA 137 137  --   K 3.4* 4.2  --   CL 108 109  --   CO2 20* 20*  --   GLUCOSE 259* 180*  --   BUN 13 13  --   CREATININE 0.71 0.78 0.84  CALCIUM 8.9 8.3*  --   MG  --  1.5*  --   AST 27  --   --   ALT 20  --   --   ALKPHOS 61  --   --   BILITOT 0.4  --   --    ------------------------------------------------------------------------------------------------------------------  Cardiac Enzymes  Recent Labs Lab 11/21/16 1927  TROPONINI <0.03   ------------------------------------------------------------------------------------------------------------------  RADIOLOGY:  No results found.   ASSESSMENT AND PLAN:    70 year old male with a past medical history significant for hypertension presented with angiedema  1. 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 Sutures to be removed 12/27 days with possible tracheostomy removal. Discussed with patient he should not eat seafood/fish and Ace inhibitors should be discontinued. He will need  EpiPen at discharge Continue diet as per speech consult   2. Essential hypertension: Continue Norvasc When necessary hydralazine BP  acceptable 3. Hyperglycemia: hemoglobin A1c pending  Physical therapy consultation today.  Management plans discussed with the patient and wife and they are in agreement.  CODE STATUS: full  TOTAL TIME TAKING CARE OF THIS PATIENT: 22 minutes.     POSSIBLE D/C 12/28, DEPENDING ON CLINICAL CONDITION.   Kesha Hurrell M.D on 11/25/2016 at 7:31 AM  Between 7am to 6pm - Pager - [phone] After 6pm go to [email or web address] - password [email or web address] [facility]  Office  [phone]  CC: Primary care physician; No PCP Per Patient  Note: This dictation was prepared with Dragon dictation along with smaller phrase technology. Any transcriptional errors that result from this process are unintentional.

## 2016-11-26 LAB — HEMOGLOBIN A1C
Hgb A1c MFr Bld: 6.1 % — ABNORMAL HIGH (ref 4.8–5.6)
Mean Plasma Glucose: 128 mg/dL

## 2016-11-26 LAB — CBC
HCT: 31.4 % — ABNORMAL LOW (ref 40.0–52.0)
HEMOGLOBIN: 10.4 g/dL — AB (ref 13.0–18.0)
MCH: 27.9 pg (ref 26.0–34.0)
MCHC: 33 g/dL (ref 32.0–36.0)
MCV: 84.6 fL (ref 80.0–100.0)
Platelets: 227 10*3/uL (ref 150–440)
RBC: 3.71 MIL/uL — ABNORMAL LOW (ref 4.40–5.90)
RDW: 14.1 % (ref 11.5–14.5)
WBC: 9.9 10*3/uL (ref 3.8–10.6)

## 2016-11-26 LAB — BASIC METABOLIC PANEL
ANION GAP: 8 (ref 5–15)
BUN: 11 mg/dL (ref 6–20)
CALCIUM: 9.3 mg/dL (ref 8.9–10.3)
CO2: 29 mmol/L (ref 22–32)
CREATININE: 0.77 mg/dL (ref 0.61–1.24)
Chloride: 101 mmol/L (ref 101–111)
GFR calc Af Amer: >60 mL/min (ref >60)
GFR calc non Af Amer: >60 mL/min (ref >60)
GLUCOSE: 144 mg/dL — AB (ref 65–99)
Potassium: 3.3 mmol/L — ABNORMAL LOW (ref 3.5–5.1)
Sodium: 138 mmol/L (ref 135–145)

## 2016-11-26 MED ORDER — BISACODYL 10 MG RE SUPP
10.0000 mg | Freq: Every day | RECTAL | Status: DC
  Administered 2016-11-26 – 2016-11-27 (×2): 10 mg via RECTAL
  Filled 2016-11-26 (×3): qty 1

## 2016-11-26 MED ORDER — POLYETHYLENE GLYCOL 3350 17 G PO PACK
17.0000 g | PACK | Freq: Every day | ORAL | Status: DC
  Administered 2016-11-26 – 2016-11-27 (×2): 17 g via ORAL
  Filled 2016-11-26 (×3): qty 1

## 2016-11-26 MED ORDER — POTASSIUM CHLORIDE 20 MEQ/15ML (10%) PO SOLN
40.0000 meq | Freq: Once | ORAL | Status: AC
  Administered 2016-11-26: 40 meq via ORAL
  Filled 2016-11-26: qty 30

## 2016-11-26 NOTE — Progress Notes (Signed)
Pt in room with wife. CH had been with Pt some days before when he entered Ed. CH is available.   11/26/16 1145  Clinical Encounter Type  Visited With Patient and family together  Visit Type Follow-up  Referral From Chaplain  Spiritual Encounters  Spiritual Needs Emotional  Stress Factors  Patient Stress Factors None identified  Family Stress Factors None identified

## 2016-11-26 NOTE — Progress Notes (Addendum)
[place] [facility] [place] NAME: Ethan Mills    MR#:  [id number]  DATE OF BIRTH:  [birth year]  SUBJECTIVE:  No events overnight Doing well   REVIEW OF SYSTEMS:    Review of Systems  Constitutional: Negative.  Negative for chills, fever and malaise/fatigue.  HENT: Negative.  Negative for ear discharge, ear pain, hearing loss, nosebleeds and sore throat.        Trach in place  Eyes: Negative.  Negative for blurred vision and pain.  Respiratory: Negative.  Negative for cough, hemoptysis, shortness of breath and wheezing.   Cardiovascular: Negative.  Negative for chest pain, palpitations and leg swelling.  Gastrointestinal: Negative.  Negative for abdominal pain, blood in stool, diarrhea, nausea and vomiting.  Genitourinary: Negative.  Negative for dysuria.  Musculoskeletal: Negative.  Negative for back pain.  Skin: Negative.   Neurological: Negative for dizziness, tremors, speech change, focal weakness, seizures and headaches.  Endo/Heme/Allergies: Negative.  Does not bruise/bleed easily.  Psychiatric/Behavioral: Negative.  Negative for depression, hallucinations and suicidal ideas.    Tolerating Diet: yes      DRUG ALLERGIES:   Allergies  Allergen Reactions  . Lisinopril Anaphylaxis  . Shellfish Allergy Anaphylaxis    VITALS:  Blood pressure 121/66, pulse (!) 101, temperature 99.1 F (37.3 C), temperature source Oral, resp. rate 18, height 5\' 8"  (1.727 m), weight 81.3 kg (179 lb 3.7 oz), SpO2 100 %.  PHYSICAL EXAMINATION:   Physical Exam  Constitutional: He is oriented to person, place, and time and well-developed, well-nourished, and in no distress. No distress.  HENT:  Head: Normocephalic.  Eyes: No scleral icterus.  Neck: Normal range of motion. Neck supple. No JVD present. No tracheal deviation present.  Trach placed  Cardiovascular: Normal rate, regular rhythm and normal heart sounds.  Exam reveals no gallop and no  friction rub.   No murmur heard. Pulmonary/Chest: Effort normal and breath sounds normal. No stridor. No respiratory distress. He has no wheezes. He has no rales. He exhibits no tenderness.  Abdominal: Soft. Bowel sounds are normal. He exhibits no distension and no mass. There is no tenderness. There is no rebound and no guarding.  Musculoskeletal: Normal range of motion. He exhibits no edema.  Neurological: He is alert and oriented to person, place, and time.  Skin: Skin is warm. No rash noted. No erythema.  Psychiatric: Affect and judgment normal.      LABORATORY PANEL:   CBC  Recent Labs Lab 11/26/16 0436  WBC 9.9  HGB 10.4*  HCT 31.4*  PLT 227   ------------------------------------------------------------------------------------------------------------------  Chemistries   Recent Labs Lab 11/21/16 1927 11/22/16 0641  11/26/16 0436  NA 137 137  --  138  K 3.4* 4.2  --  3.3*  CL 108 109  --  101  CO2 20* 20*  --  29  GLUCOSE 259* 180*  --  144*  BUN 13 13  --  11  CREATININE 0.71 0.78  < > 0.77  CALCIUM 8.9 8.3*  --  9.3  MG  --  1.5*  --   --   AST 27  --   --   --   ALT 20  --   --   --   ALKPHOS 61  --   --   --   BILITOT 0.4  --   --   --   < > = values in this interval not displayed. ------------------------------------------------------------------------------------------------------------------  Cardiac Enzymes  Recent Labs  Lab 11/21/16 1927  TROPONINI <0.03   ------------------------------------------------------------------------------------------------------------------  RADIOLOGY:  No results found.   ASSESSMENT AND PLAN:    70 year old male with a past medical history significant for hypertension presented with angiedema  1. 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 Sutures to be removed 12/27 days with possible  decannulation 12/28 as per Dr Vaught's note Discussed with patient he should not eat seafood/fish and Ace inhibitors should be discontinued. He will need EpiPen at discharge Continue diet as per speech consult   2. Essential hypertension: Continue Norvasc When necessary hydralazine BP  Acceptable today 3. Hyperglycemia: A1c is 6.1 Needs close outpatient follow up   4. Hypokalemia: Replete and recheck in a.m.  Physical therapy consultation requested  Management plans discussed with the patient and wife and they are in agreement.  CODE STATUS: full  TOTAL TIME TAKING CARE OF THIS PATIENT: 22 minutes.     POSSIBLE D/C 12/28, DEPENDING ON CLINICAL CONDITION.   Abdishakur Gottschall M.D on 11/26/2016 at 10:13 AM  Between 7am to 6pm - Pager - [phone] After 6pm go to [email or web address] - password [email or web address] [facility]  Office  [phone]  CC: Primary care physician; No PCP Per Patient  Note: This dictation was prepared with Dragon dictation along with smaller phrase technology. Any transcriptional errors that result from this process are unintentional.

## 2016-11-26 NOTE — Progress Notes (Signed)
Speech Language Pathology Treatment: Dysphagia  Patient Details Name: Ethan Mills MRN: [id number] DOB: [birth year] Today's Date: 11/26/2016 Time: [id number] SLP Time Calculation (min) (ACUTE ONLY): 30 min  Assessment / Plan / Recommendation Clinical Impression  Pt presents with a moderate oral pharyngeal dysphagia characterized by decreased ability to functionally masticate regular textures. Pt is noted to have a trach that will be re assessed tomorrow. Pt with no overt ssx aspiration with ndds 2 trials and thin liquids. Pt with no complaints of pain or feelings of food being difficult to swallow. SLP recommends to moisten fods well and re assess post assessment of trach. If trach remains he may benefit from [employer] for communication.    HPI HPI: Pt is 70 year old male with no medical history on file. History was obtained from his wife present at bedside.  Wife states that patient went out to eat some seafood and came home with swelling of the throat and unable to speak.  Wife also states that he takes Lisinopril for his high blood pressure.  He had swelling of his throat before but wife states that "it was not this bad".  Patient presented to ED with acute respiratory distress. Due to impending Respiratory Failure, ED physician attempted to intubate with Glyde laryngoscope without success.  Therefore ENT was called and had emergently trached at the bedside.  PCCM team was called to admit the patient. Currently, pt is resting in bed w/ trach collar for O2 support at trach level - trach cuff deflated just prior to this evaluation. NG+ for meds and nutritional support. Pt is alert/oriented and follows through w/ instructions from SLP. Mostly nonverbal but phonating some and nodding head/gesturing for communication.      SLP Plan  Continue with current plan of care     Recommendations  Diet recommendations: Dysphagia 2 (fine chop);Thin liquid Liquids provided via: Cup;Teaspoon;No straw Medication  Administration: Crushed with puree Supervision: Patient able to self feed Compensations: Minimize environmental distractions;Small sips/bites;Slow rate;Follow solids with liquid Postural Changes and/or Swallow Maneuvers: Out of bed for meals;Seated upright 90 degrees                Oral Care Recommendations: Oral care BID;Staff/trained caregiver to provide oral care Plan: Continue with current plan of care       [place] 11/26/2016, 2:21 PM

## 2016-11-26 NOTE — Evaluation (Signed)
Physical Therapy Evaluation Patient Details Name: Ethan Mills MRN: [id number] DOB: Aug 01, 1946 Today's Date: 11/26/2016   History of Present Illness  presented to ER seconadry to throat swelling, SOB; admitted with acute respiratory failure secondary to angioedema.  Received emergent trach in ER, surgically revised on 12/22.  Planning for possible decannulation 12/28 per notes.  Clinical Impression  Upon evaluation, patient alert and oriented; follows all commands and demonstrates good insight/safety awareness.  Bilat UE/LE strength and ROM grossly symmetrical and WFL for basic transfers and mobility; no pain reported.  Able to complete bed mobility with mod indep; sit/stand, basic transfers and gait (400') without assist device, distant sup/mod indep.  Good gait mechanics with no significant LOB or safety concerns noted.  Tolerates all activity with sats >93% (transitioned to venti mask with trach collar, 3L O2, per RT for participation with session) throughout session. Of note, patient with no attempts at finger occlusion for enhanced communication during session; prefers gestures and head nods at this time. Appears at baseline level of functional ability without acute PT needs identified at this time.  Will complete initial PT order; please re-consult should needs change.    Follow Up Recommendations No PT follow up (did encourage continued mobility throughout nursing unit 2-3x/day with staff as appropriate; patient/nursing voiced understanding.)    Equipment Recommendations       Recommendations for Other Services       Precautions / Restrictions Precautions Precautions: Fall Precaution Comments: Trach Restrictions Weight Bearing Restrictions: No      Mobility  Bed Mobility Overal bed mobility: Modified Independent                Transfers Overall transfer level: Modified independent                  Ambulation/Gait Ambulation/Gait assistance:  Supervision;Modified independent (Device/Increase time) Ambulation Distance (Feet): 400 Feet Assistive device: None   Gait velocity: 10' walk time, 5-6 seconds   General Gait Details: reciprocal stepping pattern with good step height/length, good trunk rotation and arm swing. completes dynamic gait components without LOB or safety concern.  Stairs            Wheelchair Mobility    Modified Rankin (Stroke Patients Only)       Balance Overall balance assessment: Needs assistance Sitting-balance support: No upper extremity supported;Feet supported Sitting balance-Leahy Scale: Normal     Standing balance support: No upper extremity supported Standing balance-Leahy Scale: Good   Single Leg Stance - Right Leg: 5 Single Leg Stance - Left Leg: 10                         Pertinent Vitals/Pain Pain Assessment: No/denies pain    Home Living Family/patient expects to be discharged to:: Private residence   Available Help at Discharge: Family Type of Home: House Home Access: Level entry     Home Layout: One level        Prior Function Level of Independence: Independent         Comments: Indep with ADLs, household and community mobility     Hand Dominance        Extremity/Trunk Assessment   Upper Extremity Assessment Upper Extremity Assessment: Overall WFL for tasks assessed    Lower Extremity Assessment Lower Extremity Assessment: Overall WFL for tasks assessed       Communication   Communication: Tracheostomy (prefers yes/no questions; no attempts at finger-occlusion during session to enhance communication)  Cognition  Arousal/Alertness: Awake/alert Behavior During Therapy: WFL for tasks assessed/performed Overall Cognitive Status: Within Functional Limits for tasks assessed                      General Comments      Exercises     Assessment/Plan    PT Assessment Patent does not need any further PT services  PT Problem List             PT Treatment Interventions      PT Goals (Current goals can be found in the Care Plan section)  Acute Rehab PT Goals PT Goal Formulation: All assessment and education complete, DC therapy (unable to verbalize secondary to trach)    Frequency     Barriers to discharge        Co-evaluation               End of Session Equipment Utilized During Treatment: Gait belt;Oxygen Activity Tolerance: Patient tolerated treatment well Patient left: in chair;with call bell/phone within reach;with chair alarm set Nurse Communication: Mobility status         Time: 1314-1330 PT Time Calculation (min) (ACUTE ONLY): 16 min   Charges:   PT Evaluation $PT Eval Moderate Complexity: 1 Procedure     PT G Codes:       Dezi Brauner H. Owens Shark, PT, DPT, NCS 11/26/16, 1:47 PM [phone]

## 2016-11-26 NOTE — Progress Notes (Signed)
..   11/26/2016 9:55 AM  Lynelle Smoke [id number]  Post-Op Day 5/4    Temp:  [98.6 F (37 C)-100.3 F (37.9 C)] 99.1 F (37.3 C) (12/26 0521) Pulse Rate:  [98-105] 101 (12/26 0521) Resp:  [18] 18 (12/25 2110) BP: (121-145)/(66-77) 121/66 (12/26 0521) SpO2:  [100 %] 100 % (12/26 0928) FiO2 (%):  [28 %] 28 % (12/26 0928),     Intake/Output Summary (Last 24 hours) at 11/26/16 0955 Last data filed at 11/26/16 0400  Gross per 24 hour  Intake                0 ml  Output             1225 ml  Net            -1225 ml    Results for orders placed or performed during the hospital encounter of 11/21/16 (from the past 24 hour(s))  Basic metabolic panel     Status: Abnormal   Collection Time: 11/26/16  4:36 AM  Result Value Ref Range   Sodium 138 135 - 145 mmol/L   Potassium 3.3 (L) 3.5 - 5.1 mmol/L   Chloride 101 101 - 111 mmol/L   CO2 29 22 - 32 mmol/L   Glucose, Bld 144 (H) 65 - 99 mg/dL   BUN 11 6 - 20 mg/dL   Creatinine, Ser 0.77 0.61 - 1.24 mg/dL   Calcium 9.3 8.9 - 10.3 mg/dL   GFR calc non Af Amer >60 >60 mL/min   GFR calc Af Amer >60 >60 mL/min   Anion gap 8 5 - 15  CBC     Status: Abnormal   Collection Time: 11/26/16  4:36 AM  Result Value Ref Range   WBC 9.9 3.8 - 10.6 K/uL   RBC 3.71 (L) 4.40 - 5.90 MIL/uL   Hemoglobin 10.4 (L) 13.0 - 18.0 g/dL   HCT 31.4 (L) 40.0 - 52.0 %   MCV 84.6 80.0 - 100.0 fL   MCH 27.9 26.0 - 34.0 pg   MCHC 33.0 32.0 - 36.0 g/dL   RDW 14.1 11.5 - 14.5 %   Platelets 227 150 - 440 K/uL    SUBJECTIVE:  No acute events.  Tolerating puree diet some.  Mild discharge from wound.  Breathing well on trach collar  OBJECTIVE:   GEN-  NAD, supine in bed NECK- size 6 cuffed shiley in place and secure, cuff deflated.  Patient able to phonate some with cuff down but limited air movement  IMPRESSION:  S/p emergent cricothyroidotomy and s/p tracheostomy conversion POD#4 for angioedema  PLAN:  1)  Plan on suture removal and changing to Size 4  cuffless trach tomorrow.  If patient tolerates well, can cap trach for possible decannulation on 12/28 and then discharge.  If patient does not tolerate capping/occlusion, then will need home health and trach care teaching for plan on discharge home on 12/28 or 12/29 with size 4 trach in place.  Voris Tigert 11/26/2016, 9:55 AM

## 2016-11-27 LAB — BASIC METABOLIC PANEL
ANION GAP: 7 (ref 5–15)
BUN: 12 mg/dL (ref 6–20)
CALCIUM: 9 mg/dL (ref 8.9–10.3)
CO2: 29 mmol/L (ref 22–32)
CREATININE: 0.89 mg/dL (ref 0.61–1.24)
Chloride: 100 mmol/L — ABNORMAL LOW (ref 101–111)
GFR calc Af Amer: >60 mL/min (ref >60)
GLUCOSE: 142 mg/dL — AB (ref 65–99)
Potassium: 3.6 mmol/L (ref 3.5–5.1)
Sodium: 136 mmol/L (ref 135–145)

## 2016-11-27 NOTE — Progress Notes (Signed)
[place] [facility] [place] NAME: Ethan Mills    MRN#:  [id number]  DATE OF BIRTH:  June 25, 1946  SUBJECTIVE:  Hospital Day: 6 days Ethan Mills is a 70 y.o. male presenting with Angioedema .   Overnight events: No acute overnight events Interval Events: Sutures taken out this morning, no complaints stable with some congestion  REVIEW OF SYSTEMS:  CONSTITUTIONAL: No fever, fatigue or weakness.  EYES: No blurred or double vision.  EARS, NOSE, AND THROAT: No tinnitus or ear pain.  RESPIRATORY: No cough, shortness of breath, wheezing or hemoptysis.  CARDIOVASCULAR: No chest pain, orthopnea, edema.  GASTROINTESTINAL: No nausea, vomiting, diarrhea or abdominal pain.  GENITOURINARY: No dysuria, hematuria.  ENDOCRINE: No polyuria, nocturia,  HEMATOLOGY: No anemia, easy bruising or bleeding SKIN: No rash or lesion. MUSCULOSKELETAL: No joint pain or arthritis.   NEUROLOGIC: No tingling, numbness, weakness.  PSYCHIATRY: No anxiety or depression.   DRUG ALLERGIES:   Allergies  Allergen Reactions  . Lisinopril Anaphylaxis  . Shellfish Allergy Anaphylaxis    VITALS:  Blood pressure 110/62, pulse 100, temperature 98.7 F (37.1 C), temperature source Oral, resp. rate 20, height 5\' 8"  (1.727 m), weight 81.3 kg (179 lb 3.7 oz), SpO2 100 %.  PHYSICAL EXAMINATION:  VITAL SIGNS: Vitals:   11/27/16 1116 11/27/16 1251  BP: 108/62 110/62  Pulse: 100 100  Resp:  20  Temp: 98.8 F (37.1 C) 98.7 F (37.1 C)   GENERAL:70 y.o.male currently in no acute distress.  HEAD: Normocephalic, atraumatic.  EYES: Pupils equal, round, reactive to light. Extraocular muscles intact. No scleral icterus.  MOUTH: Moist mucosal membrane. Dentition intact. No abscess noted.  EAR, NOSE, THROAT: Clear without exudates. No external lesions.  NECK: Supple. No thyromegaly. No nodules. No JVD. Trach in place PULMONARY: Clear to ascultation, without wheeze rails or  rhonci. No use of accessory muscles, Good respiratory effort. good air entry bilaterally CHEST: Nontender to palpation.  CARDIOVASCULAR: S1 and S2. Regular rate and rhythm. No murmurs, rubs, or gallops. No edema. Pedal pulses 2+ bilaterally.  GASTROINTESTINAL: Soft, nontender, nondistended. No masses. Positive bowel sounds. No hepatosplenomegaly.  MUSCULOSKELETAL: No swelling, clubbing, or edema. Range of motion full in all extremities.  NEUROLOGIC: Cranial nerves II through XII are intact. No gross focal neurological deficits. Sensation intact. Reflexes intact.  SKIN: No ulceration, lesions, rashes, or cyanosis. Skin warm and dry. Turgor intact.  PSYCHIATRIC: Mood, affect within normal limits. The patient is awake, alert and oriented x 3. Insight, judgment intact.      LABORATORY PANEL:   CBC  Recent Labs Lab 11/26/16 0436  WBC 9.9  HGB 10.4*  HCT 31.4*  PLT 227   ------------------------------------------------------------------------------------------------------------------  Chemistries   Recent Labs Lab 11/21/16 1927 11/22/16 0641  11/27/16 0521  NA 137 137  < > 136  K 3.4* 4.2  < > 3.6  CL 108 109  < > 100*  CO2 20* 20*  < > 29  GLUCOSE 259* 180*  < > 142*  BUN 13 13  < > 12  CREATININE 0.71 0.78  < > 0.89  CALCIUM 8.9 8.3*  < > 9.0  MG  --  1.5*  --   --   AST 27  --   --   --   ALT 20  --   --   --   ALKPHOS 61  --   --   --   BILITOT 0.4  --   --   --   < > =  values in this interval not displayed. ------------------------------------------------------------------------------------------------------------------  Cardiac Enzymes  Recent Labs Lab 11/21/16 1927  TROPONINI <0.03   ------------------------------------------------------------------------------------------------------------------  RADIOLOGY:  No results found.  EKG:  No orders found for this or any previous visit.  ASSESSMENT AND PLAN:   Ethan Mills is a 70 y.o. male presenting  with Angioedema . Admitted 11/21/2016 : Day #: 6 days  1. 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 Sutures removed Speaking valve as tolerated Hopefully decannulation tomorrow   2. Essential hypertension: Continue Norvasc When necessary hydralazine  3. Hyperglycemia: A1c is 6.1 Needs close outpatient follow up   4. Hypokalemia: Resolved All the records are reviewed and case discussed with Care Management/Social Workerr. Management plans discussed with the patient, family and they are in agreement.  CODE STATUS: full TOTAL TIME TAKING CARE OF THIS PATIENT: 28 minutes.   POSSIBLE D/C IN 1DAYS, DEPENDING ON CLINICAL CONDITION.   Hower,  Karenann Cai.D on 11/27/2016 at 2:20 PM  Between 7am to 6pm - Pager - [phone]  After 6pm: House Pager: - [facility]  Office  [phone]  CC: Primary care physician; No PCP Per Patient

## 2016-11-27 NOTE — Progress Notes (Signed)
Nutrition Follow-up  DOCUMENTATION CODES:   Not applicable  INTERVENTION:  -Cater to pt preferences, encourage po intake -Offered nutritional supplement such as Ensure or Boost until po intake improve but pt declined at this time  NUTRITION DIAGNOSIS:   Inadequate oral intake related to acute illness as evidenced by NPO status.  Being addressed as diet advanced, pt appetite improving, tolerating po  GOAL:   Patient will meet greater than or equal to 90% of their needs  MONITOR:   Vent status, Labs, Weight trends  REASON FOR ASSESSMENT:   Ventilator    ASSESSMENT:   70 yo male admitted with acute respiratory failure related to angioedema s/p emergent trach at bedside  S/p trach POD 5, SLP following for diet progression and PMV trials. Noted plan for possible decannulation. Off vent support since 12/22  Diet upgraded from Dysphagia I to Dysphagia II on 12/26. Dysphagia I diet from 12/23 to 12/26. Pt happy about diet advancement. Tolerating diet well, appetite improving, ate pasta well at lunch today  Labs: reviewed Meds: reviewed  Diet Order:  DIET DYS 2 Room service appropriate? Yes; Fluid consistency: Thin  Skin:  Reviewed, no issues  Last BM:  12/26  Height:   Ht Readings from Last 1 Encounters:  11/21/16 5\' 8"  (1.727 m)    Weight:   Wt Readings from Last 1 Encounters:  11/23/16 179 lb 3.7 oz (81.3 kg)    Filed Weights   11/21/16 1853 11/21/16 2027 11/23/16 0401  Weight: 192 lb 8 oz (87.3 kg) 192 lb (87.1 kg) 179 lb 3.7 oz (81.3 kg)    BMI:  Body mass index is 27.25 kg/m.  Estimated Nutritional Needs:   Kcal:  2000-2200 kcals  Protein:  100-115 g  Fluid:  >/= 2 L  EDUCATION NEEDS:   No education needs identified at this time  [place], [place], LDN [phone] Pager  [phone] Weekend/On-Call Pager

## 2016-11-27 NOTE — Progress Notes (Signed)
..   11/27/2016 9:09 AM  Lynelle Smoke [id number]  Post-Op Day 6,5    Temp:  [98.8 F (37.1 C)-99.5 F (37.5 C)] 99.1 F (37.3 C) (12/27 0452) Pulse Rate:  [91-104] 91 (12/27 0452) Resp:  [18-20] 20 (12/27 0452) BP: (116-123)/(60-67) 116/60 (12/27 0452) SpO2:  [94 %-100 %] 100 % (12/27 0452) FiO2 (%):  [28 %] 28 % (12/26 0928),     Intake/Output Summary (Last 24 hours) at 11/27/16 0909 Last data filed at 11/27/16 0324  Gross per 24 hour  Intake                0 ml  Output              950 ml  Net             -950 ml    Results for orders placed or performed during the hospital encounter of 11/21/16 (from the past 24 hour(s))  Basic metabolic panel     Status: Abnormal   Collection Time: 11/27/16  5:21 AM  Result Value Ref Range   Sodium 136 135 - 145 mmol/L   Potassium 3.6 3.5 - 5.1 mmol/L   Chloride 100 (L) 101 - 111 mmol/L   CO2 29 22 - 32 mmol/L   Glucose, Bld 142 (H) 65 - 99 mg/dL   BUN 12 6 - 20 mg/dL   Creatinine, Ser 0.89 0.61 - 1.24 mg/dL   Calcium 9.0 8.9 - 10.3 mg/dL   GFR calc non Af Amer >60 >60 mL/min   GFR calc Af Amer >60 >60 mL/min   Anion gap 7 5 - 15    SUBJECTIVE:  No acute events overnight.  Resting well.  OBJECTIVE:   GEN-  NAD, supine in bed.  Breathing comfortably NECK-  Size 6 cuffed shiley in place with cuff deflated, sutures removed revealing vertical incision with mild drainage.  Procedure:  Trach change-  Size 6 cuffed shiley removed from patient's tracheostoma and size 4 cuffless placed without difficultly.  IMPRESSION:  S/p trach POD 5 with cuffless size 4 in place  PLAN:  1)  Speech for PMV 2)  If tolerates PMV recommend respiratory therapy cap size 4 cuffless with continuous pulse ox.  Patient also must know how to remove cap.  If tolerates cap can consider decannulation tomorrow. 3)  If patient does not tolerate capping, will need trach teaching and home health.  Saisha Hogue 11/27/2016, 9:09 AM

## 2016-11-27 NOTE — Plan of Care (Signed)
Problem: Safety: Goal: Ability to remain free from injury will improve Outcome: Progressing Discussed safe ambulation with patient and family member

## 2016-11-27 NOTE — Progress Notes (Deleted)
Dr. Jannifer Franklin notified of bladder scanner results; acknowledged; new order written. Barbaraann Faster, RN; 9:33 PM; 11/27/2016

## 2016-11-28 NOTE — Progress Notes (Addendum)
Speech Language Pathology Treatment: Dysphagia  Patient Details Name: Ethan Mills MRN: [id number] DOB: 28-Jul-1946 Today's Date: 11/28/2016 Time: 1250-1310 SLP Time Calculation (min) (ACUTE ONLY): 20 min  Assessment / Plan / Recommendation Clinical Impression  Pt has been tolerating a Dysphagia II diet w/thin liquids and no straws and pt now has capped trach. Per report pt has been tolerating capped trach well since this am. Per MD notes, may decannulate pt tomorrow. Pt given trials of solids and thin by cup and straw today. No overt s/s of aspiration were observed w/any tested consistency. Vocal quality remained clear throughout. Pt able to masticate and clear solid consistency well. No pocketing or holding was observed. Recommend upgrading diet to regular with thin liquids. Continue general aspiration precautions. Will f/u re: toleration of diet in 1-2 days. Pt's trach is now capped will complete order for PMV assessment.    HPI HPI: Pt is 70 year old male with no medical history on file. History was obtained from his wife present at bedside.  Wife states that patient went out to eat some seafood and came home with swelling of the throat and unable to speak.  Wife also states that he takes Lisinopril for his high blood pressure.  He had swelling of his throat before but wife states that "it was not this bad".  Patient presented to ED with acute respiratory distress. Due to impending Respiratory Failure, ED physician attempted to intubate with Glyde laryngoscope without success.  Therefore ENT was called and had emergently trached at the bedside on 12/22.  PCCM team was called to admit the patient. Currently, pt is resting in bed w/ trach collar for O2 support at trach level. Trach down sized yesterday from size 6 to size 4 cuffless. ENT capped trach this morning and pt has been tolerating well. Pt has been tolerating current diet.       SLP Plan  Continue with current plan of care      Recommendations  Diet recommendations: Regular;Thin liquid Liquids provided via: Cup;Straw Medication Administration: Whole meds with puree Supervision: Patient able to self feed Compensations: Minimize environmental distractions;Slow rate;Small sips/bites Postural Changes and/or Swallow Maneuvers: Out of bed for meals;Seated upright 90 degrees                Oral Care Recommendations: Oral care BID;Patient independent with oral care Follow up Recommendations: None Plan: Continue with current plan of care       GO                Mather 11/28/2016, 1:19 PM

## 2016-11-28 NOTE — Progress Notes (Signed)
[facility] at [place] NAME: Ethan Mills    MRN#:  [id number]  DATE OF BIRTH:  February 10, 1946  SUBJECTIVE:  Hospital Day: 7 days Ethan Mills is a 70 y.o. male presenting with Angioedema .   Overnight events: No acute overnight events Interval Events:  no complaints stable with some congestion  REVIEW OF SYSTEMS:  CONSTITUTIONAL: No fever, fatigue or weakness.  EYES: No blurred or double vision.  EARS, NOSE, AND THROAT: No tinnitus or ear pain.  RESPIRATORY: No cough, shortness of breath, wheezing or hemoptysis.  CARDIOVASCULAR: No chest pain, orthopnea, edema.  GASTROINTESTINAL: No nausea, vomiting, diarrhea or abdominal pain.  GENITOURINARY: No dysuria, hematuria.  ENDOCRINE: No polyuria, nocturia,  HEMATOLOGY: No anemia, easy bruising or bleeding SKIN: No rash or lesion. MUSCULOSKELETAL: No joint pain or arthritis.   NEUROLOGIC: No tingling, numbness, weakness.  PSYCHIATRY: No anxiety or depression.   DRUG ALLERGIES:   Allergies  Allergen Reactions  . Lisinopril Anaphylaxis    Angioedema, anaphylaxis  . Shellfish Allergy Anaphylaxis    VITALS:  Blood pressure 114/73, pulse (!) 103, temperature 99.2 F (37.3 C), temperature source Oral, resp. rate 20, height 5\' 8"  (1.727 m), weight 81.3 kg (179 lb 3.7 oz), SpO2 98 %.  PHYSICAL EXAMINATION:  VITAL SIGNS: Vitals:   11/28/16 0234 11/28/16 0451  BP:  114/73  Pulse:  (!) 103  Resp: 20 20  Temp:  99.2 F (37.3 C)   GENERAL:70 y.o.male currently in no acute distress.  HEAD: Normocephalic, atraumatic.  EYES: Pupils equal, round, reactive to light. Extraocular muscles intact. No scleral icterus.  MOUTH: Moist mucosal membrane. Dentition intact. No abscess noted.  EAR, NOSE, THROAT: Clear without exudates. No external lesions.  NECK: Supple. No thyromegaly. No nodules. No JVD. Trach in place PULMONARY: Clear to ascultation, without wheeze rails or rhonci. No use of  accessory muscles, Good respiratory effort. good air entry bilaterally CHEST: Nontender to palpation.  CARDIOVASCULAR: S1 and S2. Regular rate and rhythm. No murmurs, rubs, or gallops. No edema. Pedal pulses 2+ bilaterally.  GASTROINTESTINAL: Soft, nontender, nondistended. No masses. Positive bowel sounds. No hepatosplenomegaly.  MUSCULOSKELETAL: No swelling, clubbing, or edema. Range of motion full in all extremities.  NEUROLOGIC: Cranial nerves II through XII are intact. No gross focal neurological deficits. Sensation intact. Reflexes intact.  SKIN: No ulceration, lesions, rashes, or cyanosis. Skin warm and dry. Turgor intact.  PSYCHIATRIC: Mood, affect within normal limits. The patient is awake, alert and oriented x 3. Insight, judgment intact.      LABORATORY PANEL:   CBC  Recent Labs Lab 11/26/16 0436  WBC 9.9  HGB 10.4*  HCT 31.4*  PLT 227   ------------------------------------------------------------------------------------------------------------------  Chemistries   Recent Labs Lab 11/21/16 1927 11/22/16 0641  11/27/16 0521  NA 137 137  < > 136  K 3.4* 4.2  < > 3.6  CL 108 109  < > 100*  CO2 20* 20*  < > 29  GLUCOSE 259* 180*  < > 142*  BUN 13 13  < > 12  CREATININE 0.71 0.78  < > 0.89  CALCIUM 8.9 8.3*  < > 9.0  MG  --  1.5*  --   --   AST 27  --   --   --   ALT 20  --   --   --   ALKPHOS 61  --   --   --   BILITOT 0.4  --   --   --   < > =  values in this interval not displayed. ------------------------------------------------------------------------------------------------------------------  Cardiac Enzymes  Recent Labs Lab 11/21/16 1927  TROPONINI <0.03   ------------------------------------------------------------------------------------------------------------------  RADIOLOGY:  No results found.  EKG:  No orders found for this or any previous visit.  ASSESSMENT AND PLAN:   Ethan Mills is a 70 y.o. male presenting with Angioedema .  Admitted 11/21/2016 : Day #: 7 days  1. 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 Speaking valve as tolerated Hopefully decannulation tomorrow per ENT Take off Oxygen  2. Essential hypertension: Continue Norvasc When necessary hydralazine  3. Hyperglycemia: A1c is 6.1 Needs close outpatient follow up   4. Hypokalemia: Resolved All the records are reviewed and case discussed with Care Management/Social Workerr. Management plans discussed with the patient, family and they are in agreement.  CODE STATUS: full TOTAL TIME TAKING CARE OF THIS PATIENT: 28 minutes.   POSSIBLE D/C IN 1DAYS, DEPENDING ON CLINICAL CONDITION.   Riki Berninger,  Karenann Cai.D on 11/28/2016 at 12:47 PM  Between 7am to 6pm - Pager - [phone]  After 6pm: House Pager: - [phone]  [facility]  Office  [phone]  CC: Primary care physician; No PCP Per Patient

## 2016-11-28 NOTE — Plan of Care (Signed)
Problem: Tissue Perfusion: Goal: Risk factors for ineffective tissue perfusion will decrease Outcome: Adequate for Discharge O2 sats WNL with trach collar; trach patent; no SOB and no distress overnight.

## 2016-11-28 NOTE — Progress Notes (Signed)
..   11/28/2016 8:08 AM  Lynelle Smoke [id number]  Post-Op Day 7/6    Temp:  [98.7 F (37.1 C)-99.4 F (37.4 C)] 99.2 F (37.3 C) (12/28 0451) Pulse Rate:  [100-104] 103 (12/28 0451) Resp:  [20-24] 20 (12/28 0451) BP: (108-128)/(62-73) 114/73 (12/28 0451) SpO2:  [99 %-100 %] 99 % (12/28 0451) FiO2 (%):  [28 %] 28 % (12/27 1400),     Intake/Output Summary (Last 24 hours) at 11/28/16 0808 Last data filed at 11/28/16 0451  Gross per 24 hour  Intake              240 ml  Output              650 ml  Net             -410 ml    No results found for this or any previous visit (from the past 24 hour(s)).  SUBJECTIVE:  Tolerated PMV well yesterday.  No issues this a.m.  Moderate amount of drainage.  OBJECTIVE:   GEN-  NAD NECK-  Trach size 4 in place with moderate amount of secretions.  Trach capped and patient tolerated without issue.  IMPRESSION:  S/p emergent trach capped  PLAN:  Will place on pulse ox.  Showed patient how to remove cap if any issues.  Decannulate in a.m. If tolerates capped trach.  Niyati Heinke 11/28/2016, 8:08 AM

## 2016-11-29 LAB — CBC
HCT: 30.2 % — ABNORMAL LOW (ref 40.0–52.0)
Hemoglobin: 9.8 g/dL — ABNORMAL LOW (ref 13.0–18.0)
MCH: 28 pg (ref 26.0–34.0)
MCHC: 32.5 g/dL (ref 32.0–36.0)
MCV: 86 fL (ref 80.0–100.0)
PLATELETS: 270 10*3/uL (ref 150–440)
RBC: 3.51 MIL/uL — ABNORMAL LOW (ref 4.40–5.90)
RDW: 13.7 % (ref 11.5–14.5)
WBC: 9.1 10*3/uL (ref 3.8–10.6)

## 2016-11-29 LAB — CREATININE, SERUM
CREATININE: 0.86 mg/dL (ref 0.61–1.24)
GFR calc non Af Amer: >60 mL/min (ref >60)

## 2016-11-29 MED ORDER — AMLODIPINE BESYLATE 10 MG PO TABS: 10.0000 mg | ORAL_TABLET | Freq: Every day | ORAL | 0 refills | Status: AC

## 2016-11-29 MED ORDER — EPINEPHRINE 0.3 MG/0.3ML IJ SOAJ: 0.3000 mg | Freq: Once | INTRAMUSCULAR | 0 refills | Status: AC

## 2016-11-29 NOTE — Care Management Important Message (Signed)
Important Message  Patient Details  Name: Ethan Mills MRN: [id number] Date of Birth: February 12, 1946   Medicare Important Message Given:  Yes    Beverly Sessions, RN 11/29/2016, 12:03 PM

## 2016-11-29 NOTE — Progress Notes (Signed)
..   11/29/2016 10:15 AM  Ethan Mills [id number]  Post-Op Day 8,7    Temp:  [97.6 F (36.4 C)-99.4 F (37.4 C)] 99.4 F (37.4 C) (12/29 0508) Pulse Rate:  [86-103] 86 (12/29 0508) Resp:  [16-20] 17 (12/29 0508) BP: (107-125)/(60-67) 107/60 (12/29 0508) SpO2:  [97 %-99 %] 97 % (12/29 0508),     Intake/Output Summary (Last 24 hours) at 11/29/16 1015 Last data filed at 11/29/16 0508  Gross per 24 hour  Intake              120 ml  Output              450 ml  Net             -330 ml    Results for orders placed or performed during the hospital encounter of 11/21/16 (from the past 24 hour(s))  CBC     Status: Abnormal   Collection Time: 11/29/16  4:23 AM  Result Value Ref Range   WBC 9.1 3.8 - 10.6 K/uL   RBC 3.51 (L) 4.40 - 5.90 MIL/uL   Hemoglobin 9.8 (L) 13.0 - 18.0 g/dL   HCT 30.2 (L) 40.0 - 52.0 %   MCV 86.0 80.0 - 100.0 fL   MCH 28.0 26.0 - 34.0 pg   MCHC 32.5 32.0 - 36.0 g/dL   RDW 13.7 11.5 - 14.5 %   Platelets 270 150 - 440 K/uL  Creatinine, serum     Status: None   Collection Time: 11/29/16  4:23 AM  Result Value Ref Range   Creatinine, Ser 0.86 0.61 - 1.24 mg/dL   GFR calc non Af Amer >60 >60 mL/min   GFR calc Af Amer >60 >60 mL/min    SUBJECTIVE:  No acute events.  Diet advanced to regular and thin liquids.  Tolerated capped trach well overnight and yesterday with ambulation.    OBJECTIVE:  GEN-  Supine in bed NECK-  Size 4 shiley in place with moderate drainage.  Trach capped.  Trach removed and wound with fibrinous exudate.  Wound cleaned of drainage and dressed with 4X4 folded Xeroform gauze and then 2 4x4 all purpose gauze sponges and 3 inch tape to secure.  IMPRESSION:  S/p trach decannulation  PLAN:  Instructed patient and partner on how to replace dressing if it falls off.  Partner reassured on supplies.  Will need 4X 4 Xeroform gauze and 4X4 all purpose gauze sponges and 3 inch tape.  Patient will also need sterile saline for cleaning purposes of  wound.  Instructed on to leave dressing in place at all times.  Patient will need follow up on Tuesday January 2nd.  [name] 11/29/2016, 10:15 AM

## 2016-11-29 NOTE — Discharge Summary (Signed)
[facility] at [place] NAME: Ethan Mills    MR#:  [id number]  DATE OF BIRTH:  Jul 16, 1946  DATE OF ADMISSION:  11/21/2016 ADMITTING PHYSICIAN: Flora Lipps, MD  DATE OF DISCHARGE: 11/29/16  PRIMARY CARE PHYSICIAN: No PCP Per Patient    ADMISSION DIAGNOSIS:  Angioedema, initial encounter [T78.3XXA] Acute respiratory failure, unspecified whether with hypoxia or hypercapnia ([facility]) [J96.00]  DISCHARGE DIAGNOSIS:  Active Problems:   Acute respiratory failure (HCC) angioedema tracheostomy   SECONDARY DIAGNOSIS:   Past Medical History:  Diagnosis Date  . Cancer [facility])    prostate  . Diabetes mellitus without complication ([place])   . Hypertension     HOSPITAL COURSE:  Ethan Mills  is a 70 y.o. male admitted 11/21/2016 with chief complaint Angioedema . Please see H&P performed by Flora Lipps, MD for further information. Patient presented with signs and symptoms of angio edema. Underwent emergent tracheostomy at bedside in the ED. Underwent conversion tracheostomy [id number] without complication. Sutures removed POD#5, decannulated [id number] without complication   DISCHARGE CONDITIONS:   stable  CONSULTS OBTAINED:    DRUG ALLERGIES:   Allergies  Allergen Reactions  . Lisinopril Anaphylaxis    Angioedema, anaphylaxis  . Shellfish Allergy Anaphylaxis    DISCHARGE MEDICATIONS:   Current Discharge Medication List    START taking these medications   Details  amLODipine (NORVASC) 10 MG tablet Take 1 tablet (10 mg total) by mouth daily. Qty: 30 tablet, Refills: 0    EPINEPHrine 0.3 mg/0.3 mL IJ SOAJ injection Inject 0.3 mLs (0.3 mg total) into the muscle once. Qty: 0.3 mL, Refills: 0      CONTINUE these medications which have NOT CHANGED   Details  Cholecalciferol (D3-1000) 1000 units tablet Take 2,000 Units by mouth daily.    metFORMIN (GLUCOPHAGE) 500 MG tablet Take 500 mg by mouth 2 (two) times daily with a meal.      sildenafil (VIAGRA) 100 MG tablet Take 100 mg by mouth daily as needed for erectile dysfunction.      STOP taking these medications     lisinopril (PRINIVIL,ZESTRIL) 5 MG tablet          DISCHARGE INSTRUCTIONS:  Instructed patient and partner on how to replace dressing if it falls off.  Partner reassured on supplies.  Will need 4X 4 Xeroform gauze and 4X4 all purpose gauze sponges and 3 inch tape.  Patient will also need sterile saline for cleaning purposes of wound.  Instructed on to leave dressing in place at all times.  Patient will need follow up on Tuesday January 2nd.  DIET:  Regular diet NO SHELLFISH  DISCHARGE CONDITION:  Stable  ACTIVITY:  Activity as tolerated  OXYGEN:  Home Oxygen: No.   Oxygen Delivery: room air  DISCHARGE LOCATION:  home   If you experience worsening of your admission symptoms, develop shortness of breath, life threatening emergency, suicidal or homicidal thoughts you must seek medical attention immediately by calling 911 or calling your MD immediately  if symptoms less severe.  You Must read complete instructions/literature along with all the possible adverse reactions/side effects for all the Medicines you take and that have been prescribed to you. Take any new Medicines after you have completely understood and accpet all the possible adverse reactions/side effects.   Please note  You were cared for by a hospitalist during your hospital stay. If you have any questions about your discharge medications or the care you received while you were in  the hospital after you are discharged, you can call the unit and asked to speak with the hospitalist on call if the hospitalist that took care of you is not available. Once you are discharged, your primary care physician will handle any further medical issues. Please note that NO REFILLS for any discharge medications will be authorized once you are discharged, as it is imperative that you return to your  primary care physician (or establish a relationship with a primary care physician if you do not have one) for your aftercare needs so that they can reassess your need for medications and monitor your lab values.    On the day of Discharge:   VITAL SIGNS:  Blood pressure (!) 111/96, pulse 96, temperature 99.4 F (37.4 C), temperature source Oral, resp. rate 18, height 5\' 8"  (1.727 m), weight 81.3 kg (179 lb 3.7 oz), SpO2 100 %.  I/O:   Intake/Output Summary (Last 24 hours) at 11/29/16 1213 Last data filed at 11/29/16 0508  Gross per 24 hour  Intake              120 ml  Output              450 ml  Net             -330 ml    PHYSICAL EXAMINATION:  GENERAL:  70 y.o.-year-old patient lying in the bed with no acute distress.  EYES: Pupils equal, round, reactive to light and accommodation. No scleral icterus. Extraocular muscles intact.  HEENT: Head atraumatic, normocephalic. Oropharynx and nasopharynx clear.  NECK:  Supple, no jugular venous distention. No thyroid enlargement, no tenderness.  LUNGS: Normal breath sounds bilaterally, no wheezing, rales,rhonchi or crepitation. No use of accessory muscles of respiration.  CARDIOVASCULAR: S1, S2 normal. No murmurs, rubs, or gallops.  ABDOMEN: Soft, non-tender, non-distended. Bowel sounds present. No organomegaly or mass.  EXTREMITIES: No pedal edema, cyanosis, or clubbing.  NEUROLOGIC: Cranial nerves II through XII are intact. Muscle strength 5/5 in all extremities. Sensation intact. Gait not checked.  PSYCHIATRIC: The patient is alert and oriented x 3.  SKIN: No obvious rash, lesion, or ulcer.   DATA REVIEW:   CBC  Recent Labs Lab 11/29/16 0423  WBC 9.1  HGB 9.8*  HCT 30.2*  PLT 270    Chemistries   Recent Labs Lab 11/27/16 0521 11/29/16 0423  NA 136  --   K 3.6  --   CL 100*  --   CO2 29  --   GLUCOSE 142*  --   BUN 12  --   CREATININE 0.89 0.86  CALCIUM 9.0  --     Cardiac Enzymes No results for input(s):  TROPONINI in the last 168 hours.  Microbiology Results  Results for orders placed or performed during the hospital encounter of 11/21/16  MRSA PCR Screening     Status: None   Collection Time: 11/21/16 10:03 PM  Result Value Ref Range Status   MRSA by PCR NEGATIVE NEGATIVE Final    Comment:        The GeneXpert MRSA Assay (FDA approved for NASAL specimens only), is one component of a comprehensive MRSA colonization surveillance program. It is not intended to diagnose MRSA infection nor to guide or monitor treatment for MRSA infections.     RADIOLOGY:  No results found.   Management plans discussed with the patient, family and they are in agreement.  CODE STATUS:     Code Status Orders  Start     Ordered   11/21/16 1953  Full code  Continuous     11/21/16 1953    Code Status History    Date Active Date Inactive Code Status Order ID Comments User Context   This patient has a current code status but no historical code status.      TOTAL TIME TAKING CARE OF THIS PATIENT: 33 minutes.    Emilyanne Mcgough,  Karenann Cai.D on 11/29/2016 at 12:13 PM  Between 7am to 6pm - Pager - [phone]  After 6pm go to [email or web address] - [facility] [facility]  Office  [phone]  CC: Primary care physician; No PCP Per Patient

## 2016-11-29 NOTE — Plan of Care (Signed)
Problem: Tissue Perfusion: Goal: Risk factors for ineffective tissue perfusion will decrease Outcome: Adequate for Discharge O2 sats WNL overnight; denies SOB or distress; for possible trach removal today.

## 2016-11-29 NOTE — Progress Notes (Signed)
Patient discharged to home, discharge instructions given as ordered. Trach re,oved this morning by Dr. Pryor Ochoa and patient vital sign stable and oxygen saturation 100% on room air. Patients vital signs monitored for a few hour and wife and patient feeling comfortable with changing the dressing to the neck if needed. Patient given extra supplies to change the dressings and patient given follow up appointment as ordered. Patient denies pain at this time, no acute distress noted.

## 2016-11-29 NOTE — Care Management (Signed)
Patient to discharge home today.  Trach removed.  Patient maintaining sats on RA.  PT has assessed patient and recommended not follow up.  No RNCM needs identified.

## 2016-11-29 NOTE — Progress Notes (Signed)
SLP Note  Patient Details Name: Ethan Mills MRN: [id number] DOB: 1946/04/05   SLP NOTE:      Reviewed chart and spoke w/pt. Pt tolerated capped trach yesterday and ENT removed trach this morning. Per pt and family report pt has been tolerating regular diet w/thin liquid. Pt to be discharged home today. Recommend continue w/regular diet. Pt to f/u w/ENT on 12/03/16. Discussed with pt that should further concerns arise regarding swallowing or speech/voice that he should f/u w/outpatient speech language services. Pt/family in agreement.      [place],Maaran 11/29/2016, 12:31 PM

## 2018-01-13 IMAGING — DX DG CHEST 1V PORT
1 series · 1 of 1 positions shown · non-contrast
Comparison: None.

CLINICAL DATA: Respiratory failure.

EXAM:
PORTABLE CHEST 1 VIEW

[chest ap]
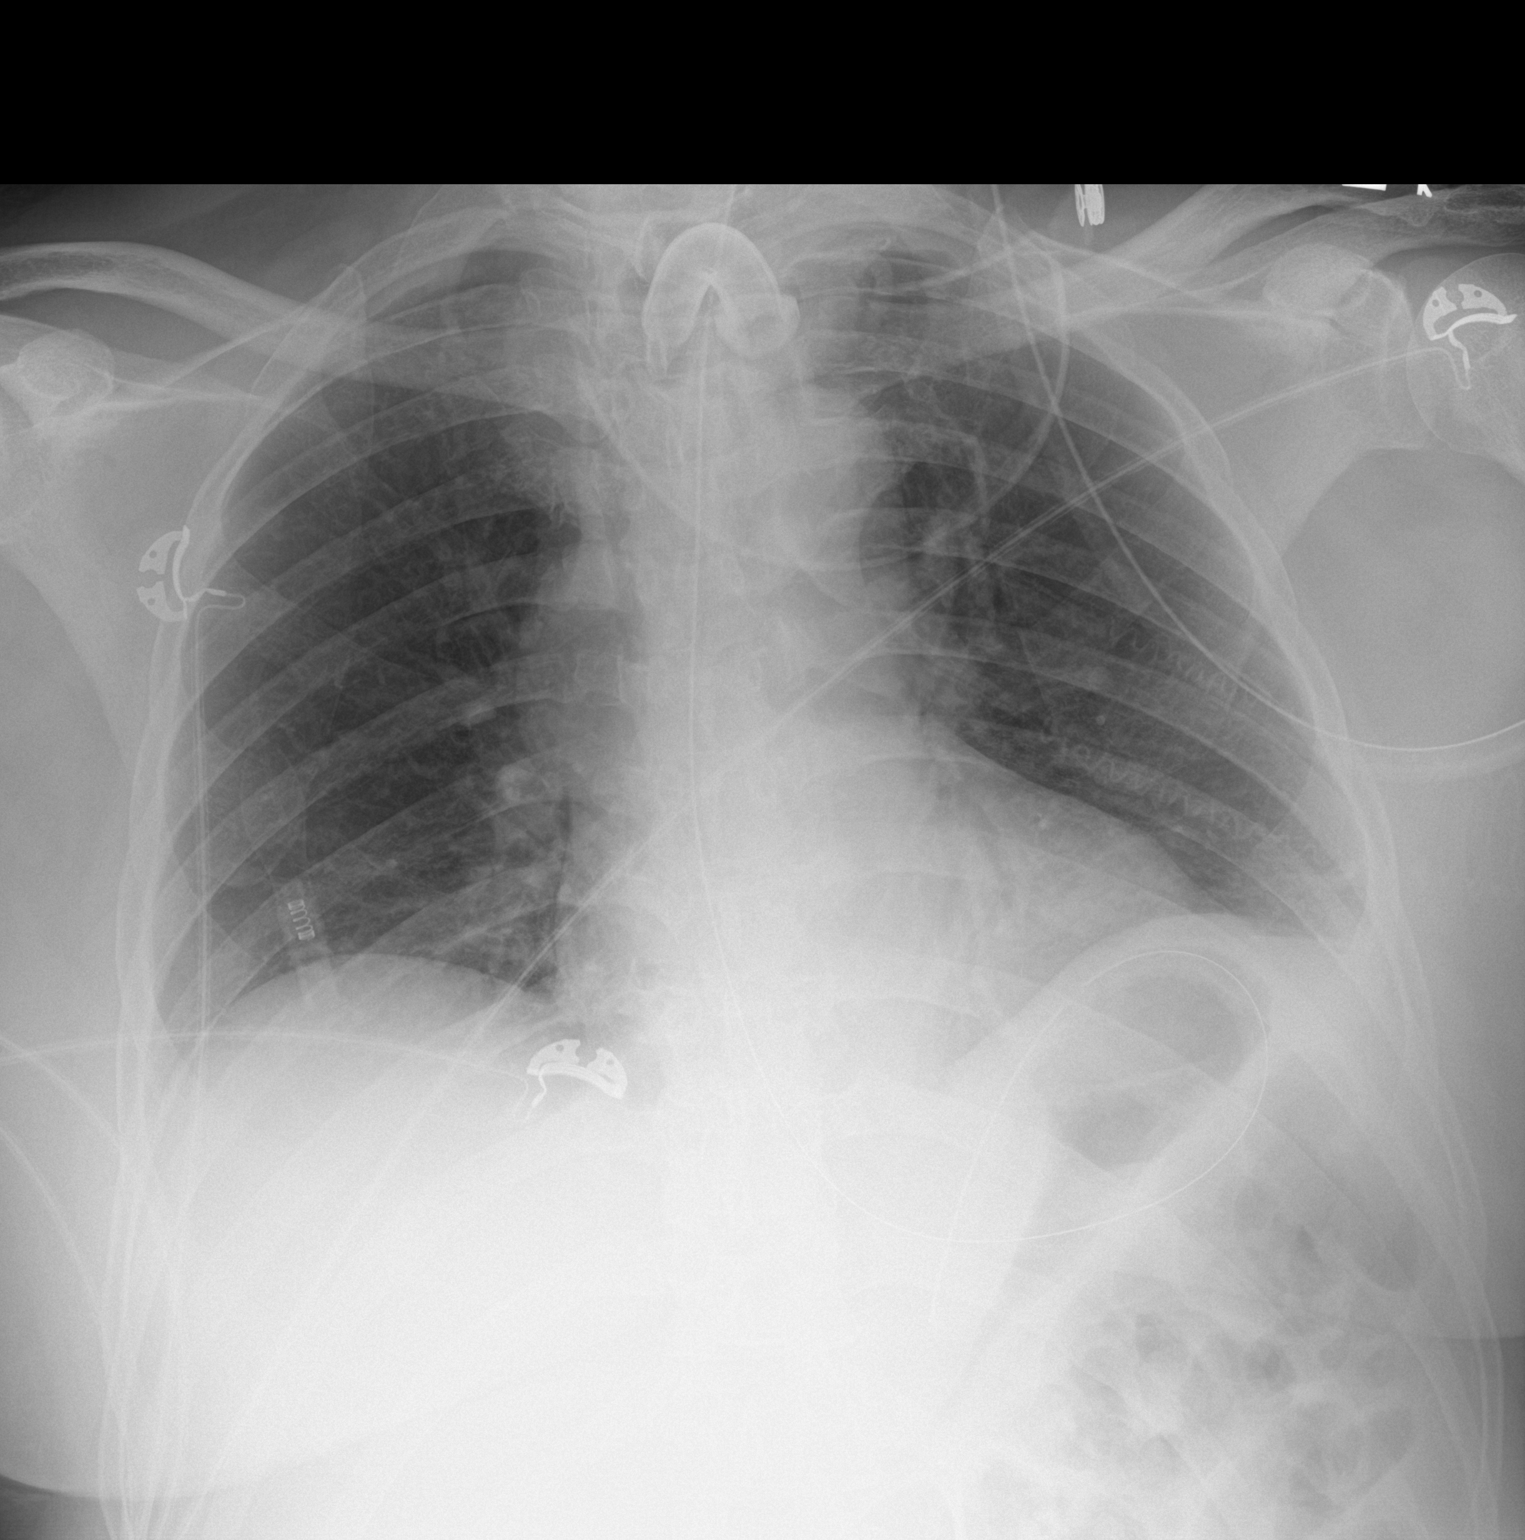

[1 of 1 positions shown; findings below may reference images not displayed]

FINDINGS: Tracheostomy tube appears in adequate position. Nasogastric tube is
coiled once over the stomach with tip over the mid to distal stomach
in the left upper quadrant. Lungs are adequately inflated with
subtle blunting of the left costophrenic angle likely small amount
left pleural fluid versus pleuroparenchymal scarring. No lobar
consolidation or pneumothorax. Cardiomediastinal silhouette is
within normal. There is minimal calcified plaque over the aortic
arch. Remainder of the exam is within normal.
IMPRESSION: Subtle blunting left costophrenic angle likely small amount of left
pleural fluid versus pleuroparenchymal scarring.

Tubes and lines as described.

## 2022-01-27 ENCOUNTER — Emergency Department
Admission: EM | Admit: 2022-01-27 | Discharge: 2022-01-28 | Disposition: A | Discharge status: Home / Self Care | Payer: No Typology Code available for payment source | Attending: Emergency Medicine | Admitting: Emergency Medicine

## 2022-01-27 ENCOUNTER — Other Ambulatory Visit: Payer: Self-pay

## 2022-01-27 DIAGNOSIS — R000 Tachycardia, unspecified: Secondary | ICD-10-CM | POA: Insufficient documentation

## 2022-01-27 DIAGNOSIS — T7840XA Allergy, unspecified, initial encounter: Secondary | ICD-10-CM | POA: Diagnosis not present

## 2022-01-27 MED ORDER — DIPHENHYDRAMINE HCL 50 MG/ML IJ SOLN
25.0000 mg | Freq: Once | INTRAMUSCULAR | Status: AC
  Administered 2022-01-27: 25 mg via INTRAVENOUS
  Filled 2022-01-27: qty 1

## 2022-01-27 MED ORDER — METHYLPREDNISOLONE SODIUM SUCC 125 MG IJ SOLR
125.0000 mg | Freq: Once | INTRAMUSCULAR | Status: AC
  Administered 2022-01-27: 125 mg via INTRAVENOUS
  Filled 2022-01-27: qty 2

## 2022-01-27 MED ORDER — PREDNISONE 20 MG PO TABS: 40.0000 mg | ORAL_TABLET | Freq: Every day | ORAL | 0 refills | Status: AC

## 2022-01-27 NOTE — ED Triage Notes (Signed)
Patient reports he used some eye drops approximately 30 minutes prior to arrival and his eyes are now itchy and that his throat feels funny.  Patient is able to speak in complete sentences, no acute respiratory distress noted, no facial swelling noted.

## 2022-01-27 NOTE — ED Provider Notes (Signed)
[facility] Provider Note    Event Date/Time   First MD Initiated Contact with Patient 01/27/22 2141     (approximate)   History   Allergic Reaction   HPI  Ethan Mills is a 76 y.o. male  who, per discharge summery dated 11/29/2016 had angioedema requiring emergent tracheostomy, who presents to the emergency department today because of concern for allergic reaction. The patient states that this evening he started noticing red and itchy eyes as well as some different throat sensation making it hard to swallow. He denies any difficulty breathing. States in the few hours symptoms have been present they have not been getting worse. The patient did not try taking any medication at home for his symptoms.    Physical Exam   Triage Vital Signs: ED Triage Vitals  Enc Vitals Group     BP 01/27/22 2025 (!) 171/83     Pulse Rate 01/27/22 2025 (!) 102     Resp 01/27/22 2025 20     Temp 01/27/22 2025 99.3 F (37.4 C)     Temp Source 01/27/22 2025 Oral     SpO2 01/27/22 2025 100 %     Weight 01/27/22 2025 180 lb (81.6 kg)     Height 01/27/22 2025 5\' 7"  (1.702 m)     Head Circumference --      Peak Flow --      Pain Score 01/27/22 2025 0     Pain Loc --      Pain Edu? --      Excl. in [place]? --     Most recent vital signs: Vitals:   01/27/22 2025 01/27/22 2129  BP: (!) 171/83 (!) 153/75  Pulse: (!) 102 (!) 104  Resp: 20 19  Temp: 99.3 F (37.4 C)   SpO2: 100% 100%    General: Awake, no distress.  CV:  Good peripheral perfusion.  Resp:  Normal effort.  Abd:  No distention.  Oropharynx: No swelling.   ED Results / Procedures / Treatments   Labs (all labs ordered are listed, but only abnormal results are displayed) Labs Reviewed - No data to display   EKG  I, Nance Pear, attending physician, personally viewed and interpreted this EKG  EKG Time: 2126 Rate: 104 Rhythm: sinus tachycardia Axis: left axis deviation Intervals: qtc 554 QRS:  RBBB ST changes: no st elevation Impression: abnormal ekg  RADIOLOGY None    PROCEDURES:  Critical Care performed: No  Procedures   MEDICATIONS ORDERED IN ED: Medications - No data to display   IMPRESSION / MDM / [place] / ED COURSE  I reviewed the triage vital signs and the nursing notes.                              Differential diagnosis includes, but is not limited to, allergic reaction, angioedema.  Patient presented to the emergency department today because of concerns for allergic reaction.  Patient has had significant angioedema in the past.  On exam no oropharyngeal swelling.  Patient was given prednisone and steroids here in the emergency department.  Patient did state that his symptoms were slowly improving.  Did discuss with patient that we would like to observe for roughly 4 hours.  Will prepare paperwork in the event the patient does not have any recurrence of symptoms.  FINAL CLINICAL IMPRESSION(S) / ED DIAGNOSES   Final diagnoses:  Allergic reaction, initial encounter  Note:  This document was prepared using Dragon voice recognition software and may include unintentional dictation errors.    Nance Pear, MD 01/27/22 [phone]

## 2022-01-27 NOTE — Discharge Instructions (Signed)
Please seek medical attention for any high fevers, chest pain, shortness of breath, change in behavior, persistent vomiting, bloody stool or any other new or concerning symptoms.  

## 2022-01-28 NOTE — ED Provider Notes (Signed)
Procedures     ----------------------------------------- 12:22 AM on 01/28/2022 ----------------------------------------- Patient continues to feel well, back to normal no symptoms.  No shortness of breath or wheezing or rash.  Eye itchiness has resolved.  No trouble with swallowing.  Ambulatory.  He wishes to be discharged which is reasonable at this point.  Not requiring admission due to resolution of symptoms     Carrie Mew, MD 01/28/22 [phone]

## 2024-11-19 ENCOUNTER — Observation Stay
Admission: EM | Admit: 2024-11-19 | Discharge: 2024-11-20 | Disposition: A | Discharge status: Home / Self Care | Attending: Internal Medicine | Admitting: Internal Medicine

## 2024-11-19 ENCOUNTER — Emergency Department

## 2024-11-19 ENCOUNTER — Observation Stay

## 2024-11-19 ENCOUNTER — Other Ambulatory Visit: Payer: Self-pay

## 2024-11-19 DIAGNOSIS — T7840XA Allergy, unspecified, initial encounter: Secondary | ICD-10-CM | POA: Diagnosis present

## 2024-11-19 DIAGNOSIS — E119 Type 2 diabetes mellitus without complications: Secondary | ICD-10-CM | POA: Diagnosis not present

## 2024-11-19 DIAGNOSIS — M47894 Other spondylosis, thoracic region: Secondary | ICD-10-CM | POA: Insufficient documentation

## 2024-11-19 DIAGNOSIS — Z8546 Personal history of malignant neoplasm of prostate: Secondary | ICD-10-CM | POA: Insufficient documentation

## 2024-11-19 DIAGNOSIS — C61 Malignant neoplasm of prostate: Secondary | ICD-10-CM | POA: Diagnosis present

## 2024-11-19 DIAGNOSIS — E785 Hyperlipidemia, unspecified: Secondary | ICD-10-CM | POA: Insufficient documentation

## 2024-11-19 DIAGNOSIS — M272 Inflammatory conditions of jaws: Secondary | ICD-10-CM

## 2024-11-19 DIAGNOSIS — I700 Atherosclerosis of aorta: Secondary | ICD-10-CM | POA: Diagnosis not present

## 2024-11-19 DIAGNOSIS — I10 Essential (primary) hypertension: Secondary | ICD-10-CM | POA: Diagnosis not present

## 2024-11-19 DIAGNOSIS — R220 Localized swelling, mass and lump, head: Secondary | ICD-10-CM

## 2024-11-19 DIAGNOSIS — K122 Cellulitis and abscess of mouth: Principal | ICD-10-CM

## 2024-11-19 LAB — CBC WITH DIFFERENTIAL/PLATELET
Abs Immature Granulocytes: 0.04 K/uL (ref 0.00–0.07)
Basophils Absolute: 0 K/uL (ref 0.0–0.1)
Basophils Relative: 1 %
Eosinophils Absolute: 0.1 K/uL (ref 0.0–0.5)
Eosinophils Relative: 1 %
HCT: 36.1 % — ABNORMAL LOW (ref 39.0–52.0)
Hemoglobin: 11.1 g/dL — ABNORMAL LOW (ref 13.0–17.0)
Immature Granulocytes: 1 %
Lymphocytes Relative: 36 %
Lymphs Abs: 2.1 K/uL (ref 0.7–4.0)
MCH: 27.3 pg (ref 26.0–34.0)
MCHC: 30.7 g/dL (ref 30.0–36.0)
MCV: 88.7 fL (ref 80.0–100.0)
Monocytes Absolute: 0.5 K/uL (ref 0.1–1.0)
Monocytes Relative: 8 %
Neutro Abs: 3.2 K/uL (ref 1.7–7.7)
Neutrophils Relative %: 53 %
Platelets: 329 K/uL (ref 150–400)
RBC: 4.07 MIL/uL — ABNORMAL LOW (ref 4.22–5.81)
RDW: 13.6 % (ref 11.5–15.5)
WBC: 6 K/uL (ref 4.0–10.5)
nRBC: 0 % (ref 0.0–0.2)

## 2024-11-19 LAB — COMPREHENSIVE METABOLIC PANEL WITH GFR
ALT: 13 U/L (ref 0–44)
AST: 34 U/L (ref 15–41)
Albumin: 3.6 g/dL (ref 3.5–5.0)
Alkaline Phosphatase: 101 U/L (ref 38–126)
Anion gap: 15 (ref 5–15)
BUN: 6 mg/dL — ABNORMAL LOW (ref 8–23)
CO2: 21 mmol/L — ABNORMAL LOW (ref 22–32)
Calcium: 9.7 mg/dL (ref 8.9–10.3)
Chloride: 98 mmol/L (ref 98–111)
Creatinine, Ser: 0.77 mg/dL (ref 0.61–1.24)
GFR, Estimated: >60 mL/min
Glucose, Bld: 308 mg/dL — ABNORMAL HIGH (ref 70–99)
Potassium: 4.2 mmol/L (ref 3.5–5.1)
Sodium: 134 mmol/L — ABNORMAL LOW (ref 135–145)
Total Bilirubin: 0.4 mg/dL (ref 0.0–1.2)
Total Protein: 7.4 g/dL (ref 6.5–8.1)

## 2024-11-19 LAB — CBG MONITORING, ED
Glucose-Capillary: 364 mg/dL — ABNORMAL HIGH (ref 70–99)
Glucose-Capillary: 295 mg/dL — ABNORMAL HIGH (ref 70–99)
Glucose-Capillary: 282 mg/dL — ABNORMAL HIGH (ref 70–99)
Glucose-Capillary: 215 mg/dL — ABNORMAL HIGH (ref 70–99)

## 2024-11-19 LAB — HEMOGLOBIN A1C
Hgb A1c MFr Bld: 10.3 % — ABNORMAL HIGH (ref 4.8–5.6)
Mean Plasma Glucose: 248.91 mg/dL

## 2024-11-19 MED ORDER — SODIUM CHLORIDE 0.9 % IV BOLUS (SEPSIS)
1000.0000 mL | Freq: Once | INTRAVENOUS | Status: AC
  Administered 2024-11-19: 1000 mL via INTRAVENOUS

## 2024-11-19 MED ORDER — DIPHENHYDRAMINE HCL 50 MG/ML IJ SOLN: 25.0000 mg | Freq: Four times a day (QID) | INTRAMUSCULAR | Status: DC | PRN

## 2024-11-19 MED ORDER — DIPHENHYDRAMINE HCL 50 MG/ML IJ SOLN
25.0000 mg | Freq: Once | INTRAMUSCULAR | Status: AC
  Administered 2024-11-19: 25 mg via INTRAVENOUS
  Filled 2024-11-19: qty 1

## 2024-11-19 MED ORDER — DEXAMETHASONE SOD PHOSPHATE PF 10 MG/ML IJ SOLN
10.0000 mg | Freq: Once | INTRAMUSCULAR | Status: AC
  Administered 2024-11-19: 10 mg via INTRAVENOUS

## 2024-11-19 MED ORDER — MORPHINE SULFATE (PF) 2 MG/ML IV SOLN: 2.0000 mg | INTRAVENOUS | Status: DC | PRN

## 2024-11-19 MED ORDER — FAMOTIDINE IN NACL 20-0.9 MG/50ML-% IV SOLN
20.0000 mg | Freq: Two times a day (BID) | INTRAVENOUS | Status: AC
  Administered 2024-11-19 – 2024-11-20 (×3): 20 mg via INTRAVENOUS
  Filled 2024-11-19 (×3): qty 50

## 2024-11-19 MED ORDER — EPINEPHRINE 0.3 MG/0.3ML IJ SOAJ
0.3000 mg | Freq: Once | INTRAMUSCULAR | Status: AC
  Administered 2024-11-19: 0.3 mg via INTRAMUSCULAR
  Filled 2024-11-19: qty 0.3

## 2024-11-19 MED ORDER — ACETAMINOPHEN 325 MG PO TABS: 650.0000 mg | ORAL_TABLET | Freq: Four times a day (QID) | ORAL | Status: DC | PRN

## 2024-11-19 MED ORDER — ACETAMINOPHEN 650 MG RE SUPP: 650.0000 mg | Freq: Four times a day (QID) | RECTAL | Status: DC | PRN

## 2024-11-19 MED ORDER — AMLODIPINE BESYLATE 5 MG PO TABS
10.0000 mg | ORAL_TABLET | Freq: Every day | ORAL | Status: DC
  Administered 2024-11-19 – 2024-11-20 (×2): 10 mg via ORAL
  Filled 2024-11-19 (×2): qty 2

## 2024-11-19 MED ORDER — SODIUM CHLORIDE 0.9 % IV SOLN: INTRAVENOUS | Status: AC

## 2024-11-19 MED ORDER — SODIUM CHLORIDE 0.9 % IV SOLN
3.0000 g | Freq: Once | INTRAVENOUS | Status: AC
  Administered 2024-11-19: 3 g via INTRAVENOUS
  Filled 2024-11-19: qty 8

## 2024-11-19 MED ORDER — METHYLPREDNISOLONE SODIUM SUCC 40 MG IJ SOLR
40.0000 mg | Freq: Two times a day (BID) | INTRAMUSCULAR | Status: DC
  Administered 2024-11-19: 40 mg via INTRAVENOUS
  Filled 2024-11-19: qty 1

## 2024-11-19 MED ORDER — POLYETHYLENE GLYCOL 3350 17 G PO PACK: 17.0000 g | PACK | Freq: Every day | ORAL | Status: DC | PRN

## 2024-11-19 MED ORDER — HYDROCODONE-ACETAMINOPHEN 5-325 MG PO TABS: 1.0000 | ORAL_TABLET | ORAL | Status: DC | PRN

## 2024-11-19 MED ORDER — TAMSULOSIN HCL 0.4 MG PO CAPS
0.4000 mg | ORAL_CAPSULE | Freq: Every day | ORAL | Status: DC
  Administered 2024-11-19: 0.4 mg via ORAL
  Filled 2024-11-19: qty 1

## 2024-11-19 MED ORDER — SODIUM CHLORIDE 0.9 % IV SOLN: 3.0000 g | Freq: Three times a day (TID) | INTRAVENOUS | Status: DC

## 2024-11-19 MED ORDER — ENOXAPARIN SODIUM 40 MG/0.4ML IJ SOSY: 40.0000 mg | PREFILLED_SYRINGE | INTRAMUSCULAR | Status: DC

## 2024-11-19 MED ORDER — IOHEXOL 300 MG/ML  SOLN
75.0000 mL | Freq: Once | INTRAMUSCULAR | Status: AC | PRN
  Administered 2024-11-19: 75 mL via INTRAVENOUS

## 2024-11-19 MED ORDER — ENOXAPARIN SODIUM 40 MG/0.4ML IJ SOSY
40.0000 mg | PREFILLED_SYRINGE | INTRAMUSCULAR | Status: DC
  Administered 2024-11-19 – 2024-11-20 (×2): 40 mg via SUBCUTANEOUS
  Filled 2024-11-19 (×2): qty 0.4

## 2024-11-19 MED ORDER — ATORVASTATIN CALCIUM 20 MG PO TABS
40.0000 mg | ORAL_TABLET | Freq: Every day | ORAL | Status: DC
  Administered 2024-11-19: 40 mg via ORAL
  Filled 2024-11-19: qty 2

## 2024-11-19 MED ORDER — SODIUM CHLORIDE 0.9 % IV SOLN
3.0000 g | Freq: Four times a day (QID) | INTRAVENOUS | Status: DC
  Administered 2024-11-19 – 2024-11-20 (×5): 3 g via INTRAVENOUS
  Filled 2024-11-19 (×5): qty 8

## 2024-11-19 MED ORDER — INSULIN ASPART 100 UNIT/ML IJ SOLN
0.0000 [IU] | Freq: Every day | INTRAMUSCULAR | Status: DC
  Administered 2024-11-19: 2 [IU] via SUBCUTANEOUS
  Filled 2024-11-19: qty 2

## 2024-11-19 MED ORDER — ONDANSETRON HCL 4 MG/2ML IJ SOLN: 4.0000 mg | Freq: Four times a day (QID) | INTRAMUSCULAR | Status: DC | PRN

## 2024-11-19 MED ORDER — INSULIN ASPART 100 UNIT/ML IJ SOLN
0.0000 [IU] | Freq: Three times a day (TID) | INTRAMUSCULAR | Status: DC
  Administered 2024-11-19: 8 [IU] via SUBCUTANEOUS
  Administered 2024-11-19: 15 [IU] via SUBCUTANEOUS
  Administered 2024-11-19: 8 [IU] via SUBCUTANEOUS
  Administered 2024-11-20: 11 [IU] via SUBCUTANEOUS
  Administered 2024-11-20: 3 [IU] via SUBCUTANEOUS
  Filled 2024-11-19: qty 11
  Filled 2024-11-19: qty 3
  Filled 2024-11-19 (×2): qty 8
  Filled 2024-11-19: qty 15

## 2024-11-19 MED ORDER — ONDANSETRON HCL 4 MG PO TABS: 4.0000 mg | ORAL_TABLET | Freq: Four times a day (QID) | ORAL | Status: DC | PRN

## 2024-11-19 NOTE — H&P (Signed)
 "  History and Physical    Ethan Mills FMW:[id number] DOB: 12/30/1945 DOA: 11/19/2024  DOS: the patient was seen and examined on 11/19/2024  [facility]   Patient coming from: Home  I have personally briefly reviewed patient's old medical records in [facility]  Chief Complaint: Allergic reaction  HPI: Ethan Mills is a pleasant 78 y.o. male with medical history significant for prostate cancer, HTN, DM who presented to ED complaining of swelling underneath his chin and underneath his tongue and lower lip that started last night around 1 AM while he was awake watching television.  He states that he had this kind of symptoms before and lisinopril was discontinued required tracheostomy.  He has not been taking lisinopril for few years now. He denies any new exposures to food, drugs, soaps, lotions, detergent or any other medications.  He is allergic to shellfish but he has not eaten any shellfish recently.  He denies any dental pain, difficulty breathing or difficulty speaking.  He just told me that when he woke up he felt some itching and tearing of his eyes but no shortness of breath, fever or throat pain.  He took 25 mg of oral Benadryl  at home without much relief.  He takes amlodipine  for high blood pressure now.  ED Course: Upon arrival to the ED, patient is found to be tachycardic around 117, hypertensive around 181/86.  CT scan of the soft tissue neck showed some periarticular lucencies around right molar area and mild induration of the floor of the mouth.  Patient was given epinephrine , Benadryl , dexamethasone  and Unasyn .  Hospitalist service was consulted for evaluation for admission for possible allergic reaction or Ludwig's angina.  Patient has aortopulmonary enlarged lymph node.  Review of Systems:  ROS  All other systems negative except as noted in the HPI.  Past Medical History:  Diagnosis Date   Cancer (HCC)    prostate   Diabetes mellitus without  complication (HCC)    Hypertension     Past Surgical History:  Procedure Laterality Date   TRACHEOSTOMY TUBE PLACEMENT N/A 11/22/2016   Procedure: TRACHEOSTOMY;  Surgeon: Carolee Hunter, MD;  Location: [facility] ORS;  Service: ENT;  Laterality: N/A;     reports that he has quit smoking. He has never used smokeless tobacco. He reports that he does not drink alcohol and does not use drugs.  Allergies[1]  History reviewed. No pertinent family history.  Prior to Admission medications  Medication Sig Start Date End Date Taking? Authorizing Provider  amLODipine  (NORVASC ) 10 MG tablet Take 1 tablet (10 mg total) by mouth daily. 11/30/16  Yes Hower, Alm POUR, MD  atorvastatin  (LIPITOR) 80 MG tablet Take 40 mg by mouth at bedtime. 12/19/23  Yes [provider]  enzalutamide REUBIN) 40 MG capsule Take 4 capsules by mouth daily. 11/04/24  Yes [provider]  loratadine (CLARITIN) 10 MG tablet Take 10 mg by mouth daily as needed. 01/27/18  Yes [provider]  metFORMIN (GLUCOPHAGE) 1000 MG tablet Take 1,000 mg by mouth 2 (two) times daily with a meal. 11/04/24  Yes [provider]  Multiple Vitamin (MULTI-VITAMIN) tablet Take 1 tablet by mouth daily.   Yes [provider]  tamsulosin  (FLOMAX ) 0.4 MG CAPS capsule Take 0.4 mg by mouth at bedtime. 02/05/24  Yes [provider]  Cholecalciferol (D3-1000) 1000 units tablet Take 2,000 Units by mouth daily. Patient not taking: Reported on 11/19/2024    [provider]  metFORMIN (GLUCOPHAGE)  500 MG tablet Take 500 mg by mouth 2 (two) times daily with a meal. Patient not taking: Reported on 11/19/2024    [provider]  sildenafil (VIAGRA) 100 MG tablet Take 100 mg by mouth daily as needed for erectile dysfunction. Patient not taking: Reported on 11/19/2024    [provider]    Physical Exam: Vitals:   11/19/24 0800 11/19/24 0828 11/19/24 0830 11/19/24 0900  BP: (!) 141/91   134/64 (!) 148/82  Pulse: 95  87 93  Resp:  17  18  Temp:  98.1 F (36.7 C)    TempSrc:  Oral    SpO2: 100% 97%  96%  Weight:      Height:        Physical Exam   Constitutional: Alert, awake, calm, comfortable HEENT: Neck supple Respiratory: Clear to auscultation B/L, no wheezing, no rales.  Cardiovascular: Regular rate and rhythm, no murmurs / rubs / gallops. No extremity edema. 2+ pedal pulses. No carotid bruits.  Abdomen: Soft, no tenderness, Bowel sounds positive.  Musculoskeletal: no clubbing / cyanosis. Good ROM, no contractures. Normal muscle tone.  Skin: no rashes, lesions, ulcers. Neurologic: CN 2-12 grossly intact. Sensation intact, No focal deficit identified Psychiatric: Alert and oriented x 3. Normal mood.    Labs on Admission: I have personally reviewed following labs and imaging studies  CBC: Recent Labs  Lab 11/19/24 0256  WBC 6.0  NEUTROABS 3.2  HGB 11.1*  HCT 36.1*  MCV 88.7  PLT 329   Basic Metabolic Panel: Recent Labs  Lab 11/19/24 0256  NA 134*  K 4.2  CL 98  CO2 21*  GLUCOSE 308*  BUN 6*  CREATININE 0.77  CALCIUM  9.7   GFR: Estimated Creatinine Clearance: 73.4 mL/min (by C-G formula based on SCr of 0.77 mg/dL). Liver Function Tests: Recent Labs  Lab 11/19/24 0256  AST 34  ALT 13  ALKPHOS 101  BILITOT 0.4  PROT 7.4  ALBUMIN 3.6   No results for input(s): LIPASE, AMYLASE in the last 168 hours. No results for input(s): AMMONIA in the last 168 hours. Coagulation Profile: No results for input(s): INR, PROTIME in the last 168 hours. Cardiac Enzymes: No results for input(s): CKTOTAL, CKMB, CKMBINDEX, TROPONINI, TROPONINIHS in the last 168 hours. BNP (last 3 results) No results for input(s): BNP in the last 8760 hours. HbA1C: No results for input(s): HGBA1C in the last 72 hours. CBG: Recent Labs  Lab 11/19/24 0807  GLUCAP 364*   Lipid Profile: No results for input(s): CHOL, HDL, LDLCALC, TRIG,  CHOLHDL, LDLDIRECT in the last 72 hours. Thyroid Function Tests: No results for input(s): TSH, T4TOTAL, FREET4, T3FREE, THYROIDAB in the last 72 hours. Anemia Panel: No results for input(s): VITAMINB12, FOLATE, FERRITIN, TIBC, IRON, RETICCTPCT in the last 72 hours. Urine analysis: No results found for: COLORURINE, APPEARANCEUR, LABSPEC, PHURINE, GLUCOSEU, HGBUR, BILIRUBINUR, KETONESUR, PROTEINUR, UROBILINOGEN, NITRITE, LEUKOCYTESUR  Radiological Exams on Admission: I have personally reviewed images CT Soft Tissue Neck W Contrast Result Date: 11/19/2024 EXAM: CT NECK WITH CONTRAST 11/19/2024 04:15:26 AM TECHNIQUE: CT of the neck was performed with the administration of 75 mL of iohexol  (OMNIPAQUE ) 300 MG/ML solution. Multiplanar reformatted images are provided for review. Automated exposure control, iterative reconstruction, and/or weight based adjustment of the mA/kV was utilized to reduce the radiation dose to as low as reasonably achievable. COMPARISON: None available. CLINICAL HISTORY: Possible allergic reaction versus odontogenic infection and Ludwig's angina. FINDINGS: AERODIGESTIVE TRACT: No discrete mass. No edema. SALIVARY GLANDS: The parotid  and submandibular glands are unremarkable. THYROID: Unremarkable. LYMPH NODES: There are shotty cervical lymph nodes present bilaterally, with the largest node measuring 11 mm in the right submandibular region. There is also an indistinct inflamed node in the aortopulmonary window, measuring approximately 2.7 x 2.3 x 2.9 cm. SOFT TISSUES: The muscles in the floor of the mouth appear somewhat blurry and fat planes appear mildly indurated. BONES: There are periapical lucencies involving the right first mandibular and maxillary molars. There is questionable focal dehiscence of the floor of the right maxillary sinus. There is mucosal disease also present within the floor of the right maxillary sinus. OTHER: The  right maxillary sinus demonstrates mucosal disease and questionable focal dehiscence of its floor. Other visualized sinuses and mastoid air cells are well aerated. Visualized lungs are clear. IMPRESSION: 1. Periapical lucencies involving the right first mandibular and maxillary molars with questionable focal dehiscence of the right maxillary sinus floor and adjacent mucosal thickening, which may reflect odontogenic sinusitis. 2. Mild induration of the floor of mouth fat planes, which can be seen with cellulitis in the setting of odontogenic infection/Ludwig's angina; no discrete drainable fluid collection is described. 3. Shotty bilateral cervical lymph nodes, largest 11 mm in the right submandibular region. 4. Enlarged indistinct aortopulmonary window lymph node measuring approximately 2.7 x 2.3 x 2.9 cm; recommend dedicated chest CT for further evaluation. Electronically signed by: Evalene Coho MD 11/19/2024 04:35 AM EST RP Workstation: [id number]    EKG: My personal interpretation of EKG shows: Sinus rhythm, right bundle branch block    Assessment/Plan Principal Problem:   Allergic reaction Active Problems:   Controlled type 2 diabetes mellitus without complication, without long-term current use of insulin  (HCC)   HTN (hypertension)   Prostate cancer (HCC)    Assessment and Plan: 78 year old male with history of diabetes, HTN, prostate cancer who came into [facility] ED for allergic reaction.  1.  Allergic reactions versus Ludwig's angina - Patient has some dental issues and also induration on the floor of the mouth. - Patient received IV steroid, epinephrine , Benadryl  and improved. - CT scan of the soft tissue showed some induration and lymph nodes. - He will be continued on Unasyn , Pepcid  and Benadryl . - He already received a steroid and has a history of diabetes.  And he feels better.  I would hold off steroids at this point. - Due to new lymph nodes around aortopulmonary area, will get  CT chest with IV contrast dedicated to lungs.  2.  HTN - Blood pressure is stable - Continue amlodipine  at home dose  3.  Type 2 diabetes - Continue insulin  sliding scale. - Patient takes only metformin at home. - Continue to monitor blood sugars  4.  Hyperlipidemia - Continue statin at home dose  5.  History of prostate cancer - Continue Flomax  - Follow-up at the [place] as discussed    DVT prophylaxis: Lovenox  Code Status: Full Code Family Communication: None  Disposition Plan: Home  Consults called: None  Admission status: Observation, Progressive   Nena Rebel, MD [facility] 11/19/2024, 9:48 AM       [1]  Allergies Allergen Reactions   Lisinopril Anaphylaxis    Angioedema, anaphylaxis   Shellfish Allergy Anaphylaxis   "

## 2024-11-19 NOTE — Progress Notes (Signed)
 PHARMACY NOTE:  ANTIMICROBIAL RENAL DOSAGE ADJUSTMENT  Current antimicrobial regimen includes a mismatch between antimicrobial dosage and estimated renal function.  As per policy approved by the Pharmacy & Therapeutics and Medical Executive Committees, the antimicrobial dosage will be adjusted accordingly.  Current antimicrobial dosage:  Unasyn  3 gm IV q8h  Indication: Possible odontogenic infection   Renal Function:  Estimated Creatinine Clearance: 73.4 mL/min (by C-G formula based on SCr of 0.77 mg/dL).     Antimicrobial dosage has been changed to:  Unasyn  3 gm IV q6h  Additional comments:   Thank you for allowing pharmacy to be a part of this patient's care.  Allean Haas PharmD Clinical Pharmacist 11/19/2024

## 2024-11-19 NOTE — Inpatient Diabetes Management (Signed)
 Inpatient Diabetes Program Recommendations  AACE/ADA: New Consensus Statement on Inpatient Glycemic Control   Target Ranges:  Prepandial:   less than 140 mg/dL      Peak postprandial:   less than 180 mg/dL (1-2 hours)      Critically ill patients:  140 - 180 mg/dL    Latest Reference Range & Units 11/19/24 08:07  Glucose-Capillary 70 - 99 mg/dL 635 (H)    Latest Reference Range & Units 11/19/24 02:56  CO2 22 - 32 mmol/L 21 (L)  Glucose 70 - 99 mg/dL 691 (H)  Anion gap 5 - 15  15   Review of Glycemic Control  Diabetes history: DM2 Outpatient Diabetes medications: Metformin 1000 mg BID Current orders for Inpatient glycemic control: Novolog  0-15 units TID with meals, Novolog  0-5 units at bedtime  Inpatient Diabetes Program Recommendations:    Insulin : Patient received Decadron 10 mg at 3:33 and Solumedrol 40 mg at 5:58 am today. No other steroids ordered at this time. Initial lab glucose 308 mg/dl at 7:43 am today.  May want to consider ordering Lantus 8 units Q24H.  HbgA1C: Current A1C in process.   Thanks, Ethan Gainer, RN, MSN, CDCES Diabetes Coordinator Inpatient Diabetes Program [phone] (Team Pager from 8am to 5pm)

## 2024-11-19 NOTE — ED Notes (Signed)
 Called lab to confirm that pts Hgb A1C could be added on to the purple top already sent up this morning.  Lab tech confirmed that she could add it on.

## 2024-11-19 NOTE — ED Notes (Signed)
 Pt unhooked from IV fluids and VS monitoring equipment and pt ambulated to the in room toilet unassisted from staff. Pt returned to bed and was reconnected to VS monitoring equipment and IV fluids. Pt tolerated activity well, NAD noted at this time. Bed is in the lowest, locked position with call bell in reach.

## 2024-11-19 NOTE — ED Provider Notes (Signed)
 "  [facility] Provider Note    Event Date/Time   First MD Initiated Contact with Patient 11/19/24 0244     (approximate)   History   Allergic Reaction   HPI  Ethan Mills is a 78 y.o. male with history of prostate cancer, hypertension, diabetes who presents to the emergency department with complaints of swelling underneath his chin and underneath his tongue and his lower lip that started tonight around 1 AM while he was awake watching television.  He states this has happened to him before when he was taking lisinopril and required a tracheostomy.  He denies being on any ACE inhibitors or ARB's but states he does take blood pressure medication but cannot recall exactly what it is.  He denies any new exposures to foods, soaps, lotions, detergents, medications.  He is allergic to shellfish but has not been exposed to this anytime recently.  He denies any dental pain.  States it is hard to swallow and talk but no difficulty breathing.  He states he did wake up and have some itching and tearing of his eyes but no urticaria or other itching, swelling.  No wheezing.  Took 25 mg of oral Benadryl  at home without much relief.  [facility] patient is on amlodipine  for blood pressure.   History provided by patient.    Past Medical History:  Diagnosis Date   Cancer (HCC)    prostate   Diabetes mellitus without complication (HCC)    Hypertension     Past Surgical History:  Procedure Laterality Date   TRACHEOSTOMY TUBE PLACEMENT N/A 11/22/2016   Procedure: TRACHEOSTOMY;  Surgeon: Carolee Hunter, MD;  Location: [facility] ORS;  Service: ENT;  Laterality: N/A;    MEDICATIONS:  Prior to Admission medications  Medication Sig Start Date End Date Taking? Authorizing Provider  amLODipine  (NORVASC ) 10 MG tablet Take 1 tablet (10 mg total) by mouth daily. 11/30/16   Hower, Alm POUR, MD  Cholecalciferol (D3-1000) 1000 units tablet Take 2,000 Units by mouth daily.     [provider]  metFORMIN (GLUCOPHAGE) 500 MG tablet Take 500 mg by mouth 2 (two) times daily with a meal.    [provider]  sildenafil (VIAGRA) 100 MG tablet Take 100 mg by mouth daily as needed for erectile dysfunction.    [provider]    Physical Exam   Triage Vital Signs: ED Triage Vitals  Encounter Vitals Group     BP 11/19/24 0200 (!) 181/86     Girls Systolic BP Percentile --      Girls Diastolic BP Percentile --      Boys Systolic BP Percentile --      Boys Diastolic BP Percentile --      Pulse Rate 11/19/24 0200 (!) 117     Resp 11/19/24 0200 18     Temp 11/19/24 0200 98.3 F (36.8 C)     Temp Source 11/19/24 0200 Oral     SpO2 11/19/24 0200 98 %     Weight 11/19/24 0201 170 lb (77.1 kg)     Height 11/19/24 0201 5' 7 (1.702 m)     Head Circumference --      Peak Flow --      Pain Score 11/19/24 0204 0     Pain Loc --      Pain Education --      Exclude from Growth Chart --     Most recent vital signs: Vitals:  11/19/24 0430 11/19/24 0545  BP: 129/66 (!) 143/80  Pulse: 86 82  Resp: 12 18  Temp:  98.1 F (36.7 C)  SpO2: 96% 95%    CONSTITUTIONAL: Alert, responds appropriately to questions.  Elderly HEAD: Normocephalic, atraumatic EYES: Conjunctivae clear, pupils appear equal, sclera nonicteric ENT: normal nose; moist mucous membranes, patient has mild swelling of the lower lip.  Patient has multiple dental caries but no gum swelling or sign of drainable abscess.  Tongue appears normal but he has significant swelling underneath the tongue that causes him to have slightly abnormal phonation but no stridor.  No trismus or drooling.  Posterior oropharynx, uvula are normal and airway is patent.  He also has some soft tissue swelling and firmness noted underneath his chin in the submandibular space but no tenderness, redness, warmth, induration or fluctuance.  Trachea is midline.  No thyromegaly. NECK: Supple, normal ROM, no  significant cervical lymphadenopathy appreciated CARD: Regular and tachycardic; S1 and S2 appreciated RESP: Normal chest excursion without splinting or tachypnea; breath sounds clear and equal bilaterally; no wheezes, no rhonchi, no rales, no hypoxia or respiratory distress, speaking full sentences ABD/GI: Non-distended; soft, non-tender, no rebound, no guarding, no peritoneal signs BACK: The back appears normal EXT: Normal ROM in all joints; no deformity noted, no edema SKIN: Normal color for age and race; warm; no rash on exposed skin, no urticaria NEURO: Moves all extremities equally, normal speech PSYCH: The patient's mood and manner are appropriate.   ED Results / Procedures / Treatments   LABS: (all labs ordered are listed, but only abnormal results are displayed) Labs Reviewed  CBC WITH DIFFERENTIAL/PLATELET - Abnormal; Notable for the following components:      Result Value   RBC 4.07 (*)    Hemoglobin 11.1 (*)    HCT 36.1 (*)    All other components within normal limits  COMPREHENSIVE METABOLIC PANEL WITH GFR - Abnormal; Notable for the following components:   Sodium 134 (*)    CO2 21 (*)    Glucose, Bld 308 (*)    BUN 6 (*)    All other components within normal limits  HEMOGLOBIN A1C     EKG:  EKG Interpretation Date/Time:  Friday November 19 2024 03:39:53 EST Ventricular Rate:  82 PR Interval:  147 QRS Duration:  139 QT Interval:  456 QTC Calculation: 533 R Axis:   -72  Text Interpretation: Sinus rhythm Right bundle branch block No significant change since last tracing Confirmed by Neomi Neptune [phone]) on 11/19/2024 3:47:38 AM         RADIOLOGY: My personal review and interpretation of imaging: CT shows possible odontogenic sinusitis, Ludwig's angina.  I have personally reviewed all radiology reports.   CT Soft Tissue Neck W Contrast Result Date: 11/19/2024 EXAM: CT NECK WITH CONTRAST 11/19/2024 04:15:26 AM TECHNIQUE: CT of the neck was performed  with the administration of 75 mL of iohexol  (OMNIPAQUE ) 300 MG/ML solution. Multiplanar reformatted images are provided for review. Automated exposure control, iterative reconstruction, and/or weight based adjustment of the mA/kV was utilized to reduce the radiation dose to as low as reasonably achievable. COMPARISON: None available. CLINICAL HISTORY: Possible allergic reaction versus odontogenic infection and Ludwig's angina. FINDINGS: AERODIGESTIVE TRACT: No discrete mass. No edema. SALIVARY GLANDS: The parotid and submandibular glands are unremarkable. THYROID: Unremarkable. LYMPH NODES: There are shotty cervical lymph nodes present bilaterally, with the largest node measuring 11 mm in the right submandibular region. There is also an indistinct inflamed node in the  aortopulmonary window, measuring approximately 2.7 x 2.3 x 2.9 cm. SOFT TISSUES: The muscles in the floor of the mouth appear somewhat blurry and fat planes appear mildly indurated. BONES: There are periapical lucencies involving the right first mandibular and maxillary molars. There is questionable focal dehiscence of the floor of the right maxillary sinus. There is mucosal disease also present within the floor of the right maxillary sinus. OTHER: The right maxillary sinus demonstrates mucosal disease and questionable focal dehiscence of its floor. Other visualized sinuses and mastoid air cells are well aerated. Visualized lungs are clear. IMPRESSION: 1. Periapical lucencies involving the right first mandibular and maxillary molars with questionable focal dehiscence of the right maxillary sinus floor and adjacent mucosal thickening, which may reflect odontogenic sinusitis. 2. Mild induration of the floor of mouth fat planes, which can be seen with cellulitis in the setting of odontogenic infection/Ludwig's angina; no discrete drainable fluid collection is described. 3. Shotty bilateral cervical lymph nodes, largest 11 mm in the right submandibular  region. 4. Enlarged indistinct aortopulmonary window lymph node measuring approximately 2.7 x 2.3 x 2.9 cm; recommend dedicated chest CT for further evaluation. Electronically signed by: Evalene Coho MD 11/19/2024 04:35 AM EST RP Workstation: [id number]     PROCEDURES:  Critical Care performed: Yes, see critical care procedure note(s)   CRITICAL CARE Performed by: Josette Sink   Total critical care time: 30 minutes  Critical care time was exclusive of separately billable procedures and treating other patients.  Critical care was necessary to treat or prevent imminent or life-threatening deterioration.  Critical care was time spent personally by me on the following activities: development of treatment plan with patient and/or surrogate as well as nursing, discussions with consultants, evaluation of patient's response to treatment, examination of patient, obtaining history from patient or surrogate, ordering and performing treatments and interventions, ordering and review of laboratory studies, ordering and review of radiographic studies, pulse oximetry and re-evaluation of patient's condition.   SABRA1-3 Lead EKG Interpretation  Performed by: Anzlee Hinesley, Josette SAILOR, DO Authorized by: Arthurine Oleary, Josette SAILOR, DO     Interpretation: normal     ECG rate:  86   ECG rate assessment: normal     Rhythm: sinus rhythm     Ectopy: none     Conduction: normal       IMPRESSION / MDM / ASSESSMENT AND PLAN / ED COURSE  I reviewed the triage vital signs and the nursing notes.    Patient here with swelling underneath his tongue, lower lip and underneath his chin that started at 1 AM.  No new exposures.  No fever or dental pain.  The patient is on the cardiac monitor to evaluate for evidence of arrhythmia and/or significant heart rate changes.   DIFFERENTIAL DIAGNOSIS (includes but not limited to):   Ludwig's angina, odontogenic infection, sialoadenitis, angioedema, allergic reaction, less likely deep  space neck infection   Patient's presentation is most consistent with acute presentation with potential threat to life or bodily function.   PLAN: Discussed with patient that I am concerned for possible infection/Ludwig's angina but also possible allergic reaction/angioedema.  He is not on an ACE inhibitor or ARB per his report.  Will treat with Decadron , Benadryl , epinephrine  for possible allergic component but also give antibiotics as I am concerned for possible odontogenic infection.  Will obtain CT scan to evaluate further and obtain labs.   MEDICATIONS GIVEN IN ED: Medications  enoxaparin  (LOVENOX ) injection 40 mg (has no administration in time range)  acetaminophen  (TYLENOL ) tablet 650 mg (has no administration in time range)    Or  acetaminophen  (TYLENOL ) suppository 650 mg (has no administration in time range)  ondansetron  (ZOFRAN ) tablet 4 mg (has no administration in time range)    Or  ondansetron  (ZOFRAN ) injection 4 mg (has no administration in time range)  insulin  aspart (novoLOG ) injection 0-15 Units (has no administration in time range)  insulin  aspart (novoLOG ) injection 0-5 Units (has no administration in time range)  HYDROcodone -acetaminophen  (NORCO/VICODIN) 5-325 MG per tablet 1-2 tablet (has no administration in time range)  diphenhydrAMINE  (BENADRYL ) injection 25 mg (has no administration in time range)  methylPREDNISolone  sodium succinate (SOLU-MEDROL ) 40 mg/mL injection 40 mg (has no administration in time range)  sodium chloride  0.9 % bolus 1,000 mL (0 mLs Intravenous Stopped 11/19/24 0430)  dexamethasone  (DECADRON ) injection 10 mg (10 mg Intravenous Given 11/19/24 0333)  diphenhydrAMINE  (BENADRYL ) injection 25 mg (25 mg Intravenous Given 11/19/24 0334)  EPINEPHrine  (EPI-PEN) injection 0.3 mg (0.3 mg Intramuscular Given 11/19/24 0331)  Ampicillin -Sulbactam (UNASYN ) 3 g in sodium chloride  0.9 % 100 mL IVPB (0 g Intravenous Stopped 11/19/24 0358)  iohexol  (OMNIPAQUE )  300 MG/ML solution 75 mL (75 mLs Intravenous Contrast Given 11/19/24 0406)     ED COURSE: Labs show no leukocytosis.  No significant electrolyte derangement or renal dysfunction.  CT scan concerning for odontogenic sinusitis, cellulitis, Ludwig's angina without discrete fluid collection.  No sialoadenitis.  Airway patent.  On reevaluation his lip swelling has resolved and the swelling underneath his chin has improved but not completely gone.  He still has swelling underneath the tongue but the tongue itself and posterior oropharynx appear normal.  Sats 96% on room air without any respiratory distress.  I have recommended admission for continued monitoring and antibiotics.  He is agreeable to this plan.  Will discussed with the hospitalist.   CONSULTS:  Consulted and discussed patient's case with hospitalist, Dr. Cleatus.  I have recommended admission and consulting physician agrees and will place admission orders.  Patient (and family if present) agree with this plan.   I reviewed all nursing notes, vitals, pertinent previous records.  All labs, EKGs, imaging ordered have been independently reviewed and interpreted by myself.    OUTSIDE RECORDS REVIEWED: [facility] on 12//25.       FINAL CLINICAL IMPRESSION(S) / ED DIAGNOSES   Final diagnoses:  Swelling under tongue  Ludwig's angina  Odontogenic infection of jaw     Rx / DC Orders   ED Discharge Orders     None        Note:  This document was prepared using Dragon voice recognition software and may include unintentional dictation errors.   Joshuan Bolander, Josette SAILOR, DO 11/19/24 [phone]  "

## 2024-11-19 NOTE — ED Triage Notes (Signed)
 Pt presents with oral swelling that started approx 30 min ago while watching TV. Known shellfish allergy but denies known contacts with shellfish. Had dinner tonight at home (leftovers from a funeral earlier in the week). Took 1 benadryl  prior to arrival. Speaking in full sentences, managing secretions.

## 2024-11-20 LAB — CBG MONITORING, ED
Glucose-Capillary: 200 mg/dL — ABNORMAL HIGH (ref 70–99)
Glucose-Capillary: 302 mg/dL — ABNORMAL HIGH (ref 70–99)

## 2024-11-20 MED ORDER — ACETAMINOPHEN 325 MG PO TABS: 650.0000 mg | ORAL_TABLET | Freq: Four times a day (QID) | ORAL | 0 refills | Status: AC | PRN

## 2024-11-20 MED ORDER — AMOXICILLIN-POT CLAVULANATE 875-125 MG PO TABS: 1.0000 | ORAL_TABLET | Freq: Two times a day (BID) | ORAL | 0 refills | Status: AC

## 2024-11-20 NOTE — Discharge Summary (Addendum)
 " Physician Discharge Summary   Patient: Ethan Mills MRN: [id number] DOB: Aug 04, 1946  Admit date:     11/19/2024  Discharge date: 11/20/2024  Discharge Physician: Drue ONEIDA Potter   PCP: [facility]   Recommendations at discharge:  Follow-up with ENT  Discharge Diagnoses:  1.  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  HTN 3.  Type 2 diabetes 4.  Hyperlipidemia 5.  History of prostate cancer   Hospital Course:  From HPI Ethan Mills is a pleasant 78 y.o. male with medical history significant for prostate cancer, HTN, DM who presented to ED complaining of swelling underneath his chin and underneath his tongue and lower lip that started last night around 1 AM while he was awake watching television.  He states that he had this kind of symptoms before and lisinopril was discontinued required tracheostomy.  He has not been taking lisinopril for few years now. He denies any new exposures to food, drugs, soaps, lotions, detergent or any other medications.  He is allergic to shellfish but he has not eaten any shellfish recently.  He denies any dental pain, difficulty breathing or difficulty speaking.  He just told me that when he woke up he felt some itching and tearing of his eyes but no shortness of breath, fever or throat pain.  He took 25 mg of oral Benadryl  at home without much relief.  He takes amlodipine  for high blood pressure now.   ED Course: Upon arrival to the ED, patient is found to be tachycardic around 117, hypertensive around 181/86.  CT scan of the soft tissue neck showed some periarticular lucencies around right molar area and mild induration of the floor of the mouth.  Patient was given epinephrine , Benadryl , dexamethasone  and Unasyn .  Hospitalist service was consulted for evaluation for admission for possible allergic reaction or Ludwig's angina.  Patient has aortopulmonary enlarged lymph node.    CT scan of the chest with contrast  showed: IMPRESSION: 1. Ill-defined 2.7 cm aortopulmonary window lymph node with cystic elements and surrounding fat stranding, with adjacent 1.1 cm lower paratracheal lymph node. Possibilities include viral or bacterial lymphadenitis, mycobacterial adenitis, squamous cell carcinoma metastatic disease, or lymphoma/leukemia. Correlate with clinical signs or symptoms of infection in determining whether tissue diagnosis is indicated  CT scan of soft tissue of the neck with contrast showed: 1. Periapical lucencies involving the right first mandibular and maxillary molars with questionable focal dehiscence of the right maxillary sinus floor and adjacent mucosal thickening, which may reflect odontogenic sinusitis. 2. Mild induration of the floor of mouth fat planes, which can be seen with cellulitis in the setting of odontogenic infection/Ludwig's angina; no discrete drainable fluid collection is described.  Assessment and plan:   1.  Allergic reactions versus Ludwig's angina Symptom improved with IV fluid epinephrine  and Benadryl  I discussed the findings of the lymph node enlargement with patient and the need to follow-up as an outpatient after antibiotic course to clear any underlying infection related to his teeth. He will also need to follow-up with his dentist He is feeling back to his baseline and therefore requesting to be discharged today He will complete a course of antibiotics as an outpatient   2.  HTN - Blood pressure is stable - Continue amlodipine  at home dose   3.  Type 2 diabetes - Continue insulin  sliding scale. - Patient takes only metformin at home. - Continue to monitor blood sugars   4.  Hyperlipidemia - Continue statin at home dose  5.  History of prostate cancer - Continue Flomax    Consultants: None Procedures performed: None Disposition: Home Diet recommendation:  Cardiac diet DISCHARGE MEDICATION: Allergies as of 11/20/2024       Reactions    Lisinopril Anaphylaxis   Angioedema, anaphylaxis   Shellfish Allergy Anaphylaxis        Medication List     TAKE these medications    acetaminophen  325 MG tablet Commonly known as: TYLENOL  Take 2 tablets (650 mg total) by mouth every 6 (six) hours as needed for mild pain (pain score 1-3) or fever (or Fever >/= 101).   amLODipine  10 MG tablet Commonly known as: NORVASC  Take 1 tablet (10 mg total) by mouth daily.   amoxicillin -clavulanate 875-125 MG tablet Commonly known as: AUGMENTIN  Take 1 tablet by mouth 2 (two) times daily for 5 days.   atorvastatin  80 MG tablet Commonly known as: LIPITOR Take 40 mg by mouth at bedtime.   D3-1000 25 MCG (1000 UT) tablet Generic drug: Cholecalciferol Take 2,000 Units by mouth daily.   enzalutamide 40 MG capsule Commonly known as: XTANDI Take 4 capsules by mouth daily.   loratadine 10 MG tablet Commonly known as: CLARITIN Take 10 mg by mouth daily as needed.   metFORMIN 1000 MG tablet Commonly known as: GLUCOPHAGE Take 1,000 mg by mouth 2 (two) times daily with a meal. What changed: Another medication with the same name was removed. Continue taking this medication, and follow the directions you see here.   Multi-Vitamin tablet Take 1 tablet by mouth daily.   sildenafil 100 MG tablet Commonly known as: VIAGRA Take 100 mg by mouth daily as needed for erectile dysfunction.   tamsulosin  0.4 MG Caps capsule Commonly known as: FLOMAX  Take 0.4 mg by mouth at bedtime.        Discharge Exam: Filed Weights   11/19/24 0201  Weight: 77.1 kg   Constitutional: Alert, awake, calm, comfortable HEENT: Neck supple Respiratory: Clear to auscultation B/L, no wheezing, no rales.  Cardiovascular: Regular rate and rhythm, no murmurs / rubs / gallops. No extremity edema. 2+ pedal pulses. No carotid bruits.  Abdomen: Soft, no tenderness, Bowel sounds positive.  Musculoskeletal: no clubbing / cyanosis. Good ROM, no contractures. Normal  muscle tone.  Skin: no rashes, lesions, ulcers. Neurologic: CN 2-12 grossly intact. Sensation intact, No focal deficit identified Psychiatric: Alert and oriented x 3. Normal mood.      Condition at discharge: good  The results of significant diagnostics from this hospitalization (including imaging, microbiology, ancillary and laboratory) are listed below for reference.   Imaging Studies: CT CHEST W CONTRAST Result Date: 11/19/2024 EXAM: CT CHEST WITH CONTRAST 11/19/2024 10:40:38 AM TECHNIQUE: CT of the chest was performed with the administration of 75 mL of iohexol  (OMNIPAQUE ) 300 MG/ML solution. Multiplanar reformatted images are provided for review. Automated exposure control, iterative reconstruction, and/or weight based adjustment of the mA/kV was utilized to reduce the radiation dose to as low as reasonably achievable. COMPARISON: None available. CLINICAL HISTORY: Thoracic adenopathy found on chest CT. FINDINGS: MEDIASTINUM: Heart and pericardium are unremarkable. The central airways are clear. Thoracic aortic, coronary artery, and branch vessel atheromatous vascular calcifications. Faint mitral valve calcification. LYMPH NODES: Ill defined 2.7 cm in short axis AP window lymph node with cystic elements and ill definition of surrounding fat planes noted. Along the medial margin of this process, a 1.1 cm lower paratracheal node anterior to the carina is present on image 59 series 2. No other enlarged thoracic lymph nodes  are identified. LUNGS AND PLEURA: Linear subsegmental atelectasis or scarring in the lingula. 4 x 3 mm subsolid posterobasal segment right lower lobe nodule on image 116 series 3. Suspected bandlike scarring laterally in the right lower lobe on image 118 of series 3 measuring 6 x 4 mm. No pleural effusion or pneumothorax. SOFT TISSUES/BONES: Mild lower thoracic spondylosis. No acute abnormality of the soft tissues. UPPER ABDOMEN: Small hypodense hepatic lesions are likely cysts but  some of the lesions are technically too small to characterize. Limited images of the upper abdomen demonstrates no acute abnormality. IMPRESSION: 1. Ill-defined 2.7 cm aortopulmonary window lymph node with cystic elements and surrounding fat stranding, with adjacent 1.1 cm lower paratracheal lymph node. Possibilities include viral or bacterial lymphadenitis, mycobacterial adenitis, squamous cell carcinoma metastatic disease, or lymphoma/leukemia. Correlate with clinical signs or symptoms of infection in determining whether tissue diagnosis is indicated. 2. No other thoracic lymphadenopathy and no evidence of metastatic disease. 3. 4 x 3 mm subsolid posterobasal right lower lobe pulmonary nodule. Bandlike scarring in the lateral right lower lobe, measuring 6 x 4 mm. Surveillance imaging is not indicated for low-risk patients and is considered optional for high-risk patients at 12 months. 4. Mild lower thoracic spondylosis. 5. Chronic atherosclerotic vascular calcifications, faint mitral valve calcification, lingular subsegmental atelectasis or scarring, and small hepatic hypodensities likely cysts with additional lesions too small to characterize. Electronically signed by: Ryan Salvage MD 11/19/2024 11:04 AM EST RP Workstation: [id number]   CT Soft Tissue Neck W Contrast Result Date: 11/19/2024 EXAM: CT NECK WITH CONTRAST 11/19/2024 04:15:26 AM TECHNIQUE: CT of the neck was performed with the administration of 75 mL of iohexol  (OMNIPAQUE ) 300 MG/ML solution. Multiplanar reformatted images are provided for review. Automated exposure control, iterative reconstruction, and/or weight based adjustment of the mA/kV was utilized to reduce the radiation dose to as low as reasonably achievable. COMPARISON: None available. CLINICAL HISTORY: Possible allergic reaction versus odontogenic infection and Ludwig's angina. FINDINGS: AERODIGESTIVE TRACT: No discrete mass. No edema. SALIVARY GLANDS: The parotid and  submandibular glands are unremarkable. THYROID: Unremarkable. LYMPH NODES: There are shotty cervical lymph nodes present bilaterally, with the largest node measuring 11 mm in the right submandibular region. There is also an indistinct inflamed node in the aortopulmonary window, measuring approximately 2.7 x 2.3 x 2.9 cm. SOFT TISSUES: The muscles in the floor of the mouth appear somewhat blurry and fat planes appear mildly indurated. BONES: There are periapical lucencies involving the right first mandibular and maxillary molars. There is questionable focal dehiscence of the floor of the right maxillary sinus. There is mucosal disease also present within the floor of the right maxillary sinus. OTHER: The right maxillary sinus demonstrates mucosal disease and questionable focal dehiscence of its floor. Other visualized sinuses and mastoid air cells are well aerated. Visualized lungs are clear. IMPRESSION: 1. Periapical lucencies involving the right first mandibular and maxillary molars with questionable focal dehiscence of the right maxillary sinus floor and adjacent mucosal thickening, which may reflect odontogenic sinusitis. 2. Mild induration of the floor of mouth fat planes, which can be seen with cellulitis in the setting of odontogenic infection/Ludwig's angina; no discrete drainable fluid collection is described. 3. Shotty bilateral cervical lymph nodes, largest 11 mm in the right submandibular region. 4. Enlarged indistinct aortopulmonary window lymph node measuring approximately 2.7 x 2.3 x 2.9 cm; recommend dedicated chest CT for further evaluation. Electronically signed by: Evalene Coho MD 11/19/2024 04:35 AM EST RP Workstation: [id number]    Microbiology: Results for  orders placed or performed during the hospital encounter of 11/21/16  MRSA PCR Screening     Status: None   Collection Time: 11/21/16 10:03 PM   Specimen: Nasopharyngeal  Result Value Ref Range Status   MRSA by PCR NEGATIVE  NEGATIVE Final    Comment:        The GeneXpert MRSA Assay (FDA approved for NASAL specimens only), is one component of a comprehensive MRSA colonization surveillance program. It is not intended to diagnose MRSA infection nor to guide or monitor treatment for MRSA infections.     Labs: CBC: Recent Labs  Lab 11/19/24 0256  WBC 6.0  NEUTROABS 3.2  HGB 11.1*  HCT 36.1*  MCV 88.7  PLT 329   Basic Metabolic Panel: Recent Labs  Lab 11/19/24 0256  NA 134*  K 4.2  CL 98  CO2 21*  GLUCOSE 308*  BUN 6*  CREATININE 0.77  CALCIUM  9.7   Liver Function Tests: Recent Labs  Lab 11/19/24 0256  AST 34  ALT 13  ALKPHOS 101  BILITOT 0.4  PROT 7.4  ALBUMIN 3.6   CBG: Recent Labs  Lab 11/19/24 1135 11/19/24 1904 11/19/24 2159 11/20/24 0736 11/20/24 1134  GLUCAP 295* 282* 215* 200* 302*    Discharge time spent:  38 minutes.  Signed: Drue ONEIDA Potter, MD [facility] 11/20/2024 "

## 2024-11-20 NOTE — Plan of Care (Signed)
